# Patient Record
Sex: Female | Born: 1935 | Race: White | Hispanic: No | Smoking: Never smoker
Health system: Southern US, Community
[De-identification: ages and names within clinical notes are randomized; demographics above are authoritative.]

## PROBLEM LIST (undated history)

## (undated) DIAGNOSIS — I10 Essential (primary) hypertension: Secondary | ICD-10-CM

## (undated) DIAGNOSIS — I471 Supraventricular tachycardia, unspecified: Secondary | ICD-10-CM

## (undated) DIAGNOSIS — E039 Hypothyroidism, unspecified: Secondary | ICD-10-CM

## (undated) DIAGNOSIS — H269 Unspecified cataract: Secondary | ICD-10-CM

## (undated) DIAGNOSIS — C50919 Malignant neoplasm of unspecified site of unspecified female breast: Secondary | ICD-10-CM

## (undated) DIAGNOSIS — E785 Hyperlipidemia, unspecified: Secondary | ICD-10-CM

## (undated) DIAGNOSIS — R55 Syncope and collapse: Secondary | ICD-10-CM

## (undated) DIAGNOSIS — S82891A Other fracture of right lower leg, initial encounter for closed fracture: Secondary | ICD-10-CM

## (undated) HISTORY — DX: Supraventricular tachycardia: I47.1

## (undated) HISTORY — DX: Other fracture of right lower leg, initial encounter for closed fracture: S82.891A

## (undated) HISTORY — DX: Essential (primary) hypertension: I10

## (undated) HISTORY — DX: Supraventricular tachycardia, unspecified: I47.10

## (undated) HISTORY — DX: Malignant neoplasm of unspecified site of unspecified female breast: C50.919

## (undated) HISTORY — PX: APPENDECTOMY: SHX54

## (undated) HISTORY — PX: ABDOMINAL HYSTERECTOMY: SHX81

## (undated) HISTORY — DX: Hyperlipidemia, unspecified: E78.5

## (undated) HISTORY — DX: Unspecified cataract: H26.9

## (undated) HISTORY — DX: Syncope and collapse: R55

## (undated) HISTORY — DX: Hypothyroidism, unspecified: E03.9

## (undated) HISTORY — PX: TONSILLECTOMY: SUR1361

---

## 1985-01-06 HISTORY — PX: BREAST CYST EXCISION: SHX579

## 1998-01-06 DIAGNOSIS — S82891A Other fracture of right lower leg, initial encounter for closed fracture: Secondary | ICD-10-CM

## 1998-01-06 HISTORY — DX: Other fracture of right lower leg, initial encounter for closed fracture: S82.891A

## 1999-02-21 ENCOUNTER — Encounter: Payer: Self-pay | Admitting: *Deleted

## 1999-02-21 ENCOUNTER — Encounter: Admission: RE | Admit: 1999-02-21 | Discharge: 1999-02-21 | Payer: Self-pay | Admitting: *Deleted

## 2000-03-04 ENCOUNTER — Encounter: Payer: Self-pay | Admitting: *Deleted

## 2000-03-04 ENCOUNTER — Encounter: Admission: RE | Admit: 2000-03-04 | Discharge: 2000-03-04 | Payer: Self-pay | Admitting: *Deleted

## 2000-04-15 ENCOUNTER — Ambulatory Visit (HOSPITAL_COMMUNITY): Admission: RE | Admit: 2000-04-15 | Discharge: 2000-04-15 | Payer: Self-pay | Admitting: Cardiology

## 2001-03-09 ENCOUNTER — Encounter: Admission: RE | Admit: 2001-03-09 | Discharge: 2001-03-09 | Payer: Self-pay | Admitting: *Deleted

## 2001-03-09 ENCOUNTER — Encounter: Payer: Self-pay | Admitting: *Deleted

## 2001-03-12 ENCOUNTER — Encounter: Admission: RE | Admit: 2001-03-12 | Discharge: 2001-03-12 | Payer: Self-pay | Admitting: *Deleted

## 2001-03-12 ENCOUNTER — Encounter: Payer: Self-pay | Admitting: *Deleted

## 2002-03-18 ENCOUNTER — Encounter: Payer: Self-pay | Admitting: Family Medicine

## 2002-03-18 ENCOUNTER — Encounter: Admission: RE | Admit: 2002-03-18 | Discharge: 2002-03-18 | Payer: Self-pay | Admitting: Family Medicine

## 2003-03-20 ENCOUNTER — Encounter: Admission: RE | Admit: 2003-03-20 | Discharge: 2003-03-20 | Payer: Self-pay | Admitting: Family Medicine

## 2003-12-07 ENCOUNTER — Ambulatory Visit: Payer: Self-pay | Admitting: Family Medicine

## 2004-03-11 ENCOUNTER — Ambulatory Visit: Payer: Self-pay | Admitting: Family Medicine

## 2004-04-03 ENCOUNTER — Encounter: Admission: RE | Admit: 2004-04-03 | Discharge: 2004-04-03 | Payer: Self-pay | Admitting: Family Medicine

## 2004-04-16 ENCOUNTER — Encounter: Admission: RE | Admit: 2004-04-16 | Discharge: 2004-04-16 | Payer: Self-pay | Admitting: Family Medicine

## 2004-07-11 ENCOUNTER — Ambulatory Visit: Payer: Self-pay | Admitting: Family Medicine

## 2004-10-08 ENCOUNTER — Ambulatory Visit: Payer: Self-pay | Admitting: Family Medicine

## 2004-11-20 ENCOUNTER — Ambulatory Visit: Payer: Self-pay | Admitting: Family Medicine

## 2005-03-27 ENCOUNTER — Ambulatory Visit: Payer: Self-pay | Admitting: Family Medicine

## 2005-04-17 ENCOUNTER — Encounter: Admission: RE | Admit: 2005-04-17 | Discharge: 2005-04-17 | Payer: Self-pay | Admitting: Family Medicine

## 2005-06-17 ENCOUNTER — Ambulatory Visit: Payer: Self-pay | Admitting: Family Medicine

## 2005-10-16 ENCOUNTER — Ambulatory Visit: Payer: Self-pay | Admitting: Family Medicine

## 2005-10-29 ENCOUNTER — Ambulatory Visit: Payer: Self-pay | Admitting: Family Medicine

## 2005-12-03 ENCOUNTER — Ambulatory Visit: Payer: Self-pay | Admitting: Family Medicine

## 2006-02-17 ENCOUNTER — Ambulatory Visit: Payer: Self-pay | Admitting: Family Medicine

## 2006-04-01 ENCOUNTER — Ambulatory Visit: Payer: Self-pay | Admitting: Family Medicine

## 2006-04-21 ENCOUNTER — Encounter: Admission: RE | Admit: 2006-04-21 | Discharge: 2006-04-21 | Payer: Self-pay | Admitting: Family Medicine

## 2007-04-07 DIAGNOSIS — C50919 Malignant neoplasm of unspecified site of unspecified female breast: Secondary | ICD-10-CM

## 2007-04-07 HISTORY — DX: Malignant neoplasm of unspecified site of unspecified female breast: C50.919

## 2007-04-27 ENCOUNTER — Encounter: Admission: RE | Admit: 2007-04-27 | Discharge: 2007-04-27 | Payer: Self-pay | Admitting: Family Medicine

## 2007-05-05 ENCOUNTER — Encounter (INDEPENDENT_AMBULATORY_CARE_PROVIDER_SITE_OTHER): Payer: Self-pay | Admitting: Diagnostic Radiology

## 2007-05-05 ENCOUNTER — Encounter: Admission: RE | Admit: 2007-05-05 | Discharge: 2007-05-05 | Payer: Self-pay | Admitting: Family Medicine

## 2007-05-14 ENCOUNTER — Ambulatory Visit: Payer: Self-pay | Admitting: Oncology

## 2007-05-17 ENCOUNTER — Encounter: Admission: RE | Admit: 2007-05-17 | Discharge: 2007-05-17 | Payer: Self-pay | Admitting: Family Medicine

## 2007-05-18 LAB — CBC WITH DIFFERENTIAL/PLATELET
Basophils Absolute: 0 10*3/uL (ref 0.0–0.1)
EOS%: 0.8 % (ref 0.0–7.0)
MCH: 32.2 pg (ref 26.0–34.0)
MCV: 93.3 fL (ref 81.0–101.0)
MONO%: 7.7 % (ref 0.0–13.0)
RBC: 4.35 10*6/uL (ref 3.70–5.32)
RDW: 13.5 % (ref 11.3–14.5)

## 2007-05-19 LAB — COMPREHENSIVE METABOLIC PANEL
AST: 27 U/L (ref 0–37)
Albumin: 4.7 g/dL (ref 3.5–5.2)
Alkaline Phosphatase: 28 U/L — ABNORMAL LOW (ref 39–117)
BUN: 22 mg/dL (ref 6–23)
Potassium: 3.7 mEq/L (ref 3.5–5.3)

## 2007-05-19 LAB — VITAMIN D 25 HYDROXY (VIT D DEFICIENCY, FRACTURES): Vit D, 25-Hydroxy: 27 ng/mL — ABNORMAL LOW (ref 30–89)

## 2007-05-19 LAB — CANCER ANTIGEN 27.29: CA 27.29: 20 U/mL (ref 0–39)

## 2007-05-20 ENCOUNTER — Encounter: Admission: RE | Admit: 2007-05-20 | Discharge: 2007-05-20 | Payer: Self-pay | Admitting: Oncology

## 2007-05-20 ENCOUNTER — Ambulatory Visit (HOSPITAL_COMMUNITY): Admission: RE | Admit: 2007-05-20 | Discharge: 2007-05-20 | Payer: Self-pay | Admitting: Oncology

## 2007-05-24 ENCOUNTER — Ambulatory Visit (HOSPITAL_COMMUNITY): Admission: RE | Admit: 2007-05-24 | Discharge: 2007-05-24 | Payer: Self-pay | Admitting: Oncology

## 2007-06-09 LAB — CBC WITH DIFFERENTIAL/PLATELET
EOS%: 0.8 % (ref 0.0–7.0)
MCH: 32.4 pg (ref 26.0–34.0)
MCV: 93.6 fL (ref 81.0–101.0)
MONO%: 9.7 % (ref 0.0–13.0)
RBC: 4.53 10*6/uL (ref 3.70–5.32)
RDW: 12.4 % (ref 11.3–14.5)

## 2007-06-09 LAB — PROTIME-INR
INR: 1.8 — ABNORMAL LOW (ref 2.00–3.50)
Protime: 21.6 Seconds — ABNORMAL HIGH (ref 10.6–13.4)

## 2007-07-13 ENCOUNTER — Ambulatory Visit: Payer: Self-pay | Admitting: Oncology

## 2007-07-16 LAB — CBC WITH DIFFERENTIAL/PLATELET
BASO%: 0.6 % (ref 0.0–2.0)
EOS%: 0.8 % (ref 0.0–7.0)
HCT: 41.4 % (ref 34.8–46.6)
LYMPH%: 25 % (ref 14.0–48.0)
MCH: 32.2 pg (ref 26.0–34.0)
MCHC: 34.8 g/dL (ref 32.0–36.0)
MONO#: 0.4 10*3/uL (ref 0.1–0.9)
NEUT%: 66.4 % (ref 39.6–76.8)
RBC: 4.47 10*6/uL (ref 3.70–5.32)
WBC: 6.2 10*3/uL (ref 3.9–10.0)
lymph#: 1.6 10*3/uL (ref 0.9–3.3)

## 2007-07-16 LAB — COMPREHENSIVE METABOLIC PANEL
ALT: 26 U/L (ref 0–35)
AST: 29 U/L (ref 0–37)
Chloride: 104 mEq/L (ref 96–112)
Creatinine, Ser: 0.77 mg/dL (ref 0.40–1.20)
Sodium: 138 mEq/L (ref 135–145)
Total Bilirubin: 0.5 mg/dL (ref 0.3–1.2)
Total Protein: 6.9 g/dL (ref 6.0–8.3)

## 2007-09-08 ENCOUNTER — Ambulatory Visit: Payer: Self-pay | Admitting: Oncology

## 2007-09-14 LAB — CBC WITH DIFFERENTIAL/PLATELET
Basophils Absolute: 0 10*3/uL (ref 0.0–0.1)
EOS%: 0.8 % (ref 0.0–7.0)
Eosinophils Absolute: 0 10*3/uL (ref 0.0–0.5)
HCT: 40.7 % (ref 34.8–46.6)
HGB: 14.2 g/dL (ref 11.6–15.9)
MCH: 32.8 pg (ref 26.0–34.0)
NEUT#: 3.7 10*3/uL (ref 1.5–6.5)
NEUT%: 64.3 % (ref 39.6–76.8)
RDW: 13.1 % (ref 11.3–14.5)
lymph#: 1.4 10*3/uL (ref 0.9–3.3)

## 2007-09-14 LAB — LACTATE DEHYDROGENASE: LDH: 142 U/L (ref 94–250)

## 2007-09-14 LAB — COMPREHENSIVE METABOLIC PANEL
Albumin: 4.1 g/dL (ref 3.5–5.2)
BUN: 16 mg/dL (ref 6–23)
CO2: 22 mEq/L (ref 19–32)
Calcium: 9.5 mg/dL (ref 8.4–10.5)
Chloride: 107 mEq/L (ref 96–112)
Creatinine, Ser: 0.74 mg/dL (ref 0.40–1.20)
Glucose, Bld: 152 mg/dL — ABNORMAL HIGH (ref 70–99)
Potassium: 4.1 mEq/L (ref 3.5–5.3)

## 2007-09-14 LAB — PROTHROMBIN TIME: INR: 3.8 — ABNORMAL HIGH (ref 0.0–1.5)

## 2007-09-14 LAB — CANCER ANTIGEN 27.29: CA 27.29: 24 U/mL (ref 0–39)

## 2007-09-15 LAB — CELL SEARCH FOR BREAST CANCER

## 2007-11-15 ENCOUNTER — Encounter: Admission: RE | Admit: 2007-11-15 | Discharge: 2007-11-15 | Payer: Self-pay | Admitting: Oncology

## 2007-11-23 ENCOUNTER — Ambulatory Visit: Payer: Self-pay | Admitting: Oncology

## 2007-11-25 LAB — COMPREHENSIVE METABOLIC PANEL
Albumin: 4.3 g/dL (ref 3.5–5.2)
CO2: 25 mEq/L (ref 19–32)
Calcium: 9.6 mg/dL (ref 8.4–10.5)
Glucose, Bld: 175 mg/dL — ABNORMAL HIGH (ref 70–99)
Potassium: 3.9 mEq/L (ref 3.5–5.3)
Sodium: 141 mEq/L (ref 135–145)
Total Protein: 7.3 g/dL (ref 6.0–8.3)

## 2007-11-25 LAB — CBC WITH DIFFERENTIAL/PLATELET
Eosinophils Absolute: 0.1 10*3/uL (ref 0.0–0.5)
HCT: 41.6 % (ref 34.8–46.6)
LYMPH%: 23.5 % (ref 14.0–48.0)
MCHC: 34.2 g/dL (ref 32.0–36.0)
MCV: 95.4 fL (ref 81.0–101.0)
MONO#: 0.4 10*3/uL (ref 0.1–0.9)
MONO%: 7 % (ref 0.0–13.0)
NEUT#: 4.1 10*3/uL (ref 1.5–6.5)
NEUT%: 67.9 % (ref 39.6–76.8)
Platelets: 242 10*3/uL (ref 145–400)
WBC: 6 10*3/uL (ref 3.9–10.0)

## 2007-11-25 LAB — LACTATE DEHYDROGENASE: LDH: 147 U/L (ref 94–250)

## 2008-01-17 ENCOUNTER — Ambulatory Visit: Payer: Self-pay | Admitting: Oncology

## 2008-02-07 LAB — CBC WITH DIFFERENTIAL/PLATELET
Basophils Absolute: 0 10*3/uL (ref 0.0–0.1)
Eosinophils Absolute: 0.1 10*3/uL (ref 0.0–0.5)
HGB: 14.6 g/dL (ref 11.6–15.9)
MONO#: 0.4 10*3/uL (ref 0.1–0.9)
MONO%: 5.9 % (ref 0.0–13.0)
NEUT#: 4.4 10*3/uL (ref 1.5–6.5)
RBC: 4.49 10*6/uL (ref 3.70–5.32)
RDW: 13.3 % (ref 11.3–14.5)
WBC: 6.3 10*3/uL (ref 3.9–10.0)
lymph#: 1.5 10*3/uL (ref 0.9–3.3)

## 2008-02-08 LAB — COMPREHENSIVE METABOLIC PANEL
Albumin: 4.5 g/dL (ref 3.5–5.2)
Alkaline Phosphatase: 34 U/L — ABNORMAL LOW (ref 39–117)
BUN: 19 mg/dL (ref 6–23)
CO2: 25 mEq/L (ref 19–32)
Calcium: 9.8 mg/dL (ref 8.4–10.5)
Chloride: 103 mEq/L (ref 96–112)
Glucose, Bld: 153 mg/dL — ABNORMAL HIGH (ref 70–99)
Potassium: 3.8 mEq/L (ref 3.5–5.3)
Sodium: 140 mEq/L (ref 135–145)
Total Protein: 7.2 g/dL (ref 6.0–8.3)

## 2008-02-08 LAB — CANCER ANTIGEN 27.29: CA 27.29: 16 U/mL (ref 0–39)

## 2008-02-25 ENCOUNTER — Encounter: Admission: RE | Admit: 2008-02-25 | Discharge: 2008-02-25 | Payer: Self-pay | Admitting: Surgery

## 2008-05-02 ENCOUNTER — Encounter: Admission: RE | Admit: 2008-05-02 | Discharge: 2008-05-02 | Payer: Self-pay | Admitting: Oncology

## 2008-05-17 ENCOUNTER — Ambulatory Visit: Payer: Self-pay | Admitting: Oncology

## 2008-05-19 LAB — CBC WITH DIFFERENTIAL/PLATELET
BASO%: 0.5 % (ref 0.0–2.0)
EOS%: 0.9 % (ref 0.0–7.0)
LYMPH%: 21.9 % (ref 14.0–49.7)
MCH: 32.3 pg (ref 25.1–34.0)
MCHC: 34.2 g/dL (ref 31.5–36.0)
MONO#: 0.4 10*3/uL (ref 0.1–0.9)
NEUT%: 70.8 % (ref 38.4–76.8)
Platelets: 262 10*3/uL (ref 145–400)
RBC: 4.37 10*6/uL (ref 3.70–5.45)
WBC: 7 10*3/uL (ref 3.9–10.3)
lymph#: 1.5 10*3/uL (ref 0.9–3.3)

## 2008-05-19 LAB — COMPREHENSIVE METABOLIC PANEL
Alkaline Phosphatase: 31 U/L — ABNORMAL LOW (ref 39–117)
CO2: 23 mEq/L (ref 19–32)
Creatinine, Ser: 0.81 mg/dL (ref 0.40–1.20)
Glucose, Bld: 161 mg/dL — ABNORMAL HIGH (ref 70–99)
Sodium: 138 mEq/L (ref 135–145)
Total Bilirubin: 0.5 mg/dL (ref 0.3–1.2)
Total Protein: 6.9 g/dL (ref 6.0–8.3)

## 2008-05-19 LAB — PROTIME-INR: Protime: 26.4 Seconds — ABNORMAL HIGH (ref 10.6–13.4)

## 2008-05-19 LAB — LACTATE DEHYDROGENASE: LDH: 151 U/L (ref 94–250)

## 2008-05-19 LAB — CANCER ANTIGEN 27.29: CA 27.29: 18 U/mL (ref 0–39)

## 2008-05-24 LAB — CELL SEARCH FOR BREAST CANCER

## 2008-06-06 HISTORY — PX: MASTECTOMY: SHX3

## 2008-06-13 ENCOUNTER — Encounter: Admission: RE | Admit: 2008-06-13 | Discharge: 2008-06-13 | Payer: Self-pay | Admitting: Surgery

## 2008-06-15 ENCOUNTER — Encounter (INDEPENDENT_AMBULATORY_CARE_PROVIDER_SITE_OTHER): Payer: Self-pay | Admitting: Surgery

## 2008-06-15 ENCOUNTER — Ambulatory Visit (HOSPITAL_BASED_OUTPATIENT_CLINIC_OR_DEPARTMENT_OTHER): Admission: RE | Admit: 2008-06-15 | Discharge: 2008-06-16 | Payer: Self-pay | Admitting: Surgery

## 2008-06-29 ENCOUNTER — Encounter: Admission: RE | Admit: 2008-06-29 | Discharge: 2008-06-29 | Payer: Self-pay | Admitting: Surgery

## 2008-07-12 ENCOUNTER — Ambulatory Visit: Admission: RE | Admit: 2008-07-12 | Discharge: 2008-09-12 | Payer: Self-pay | Admitting: Radiation Oncology

## 2008-09-05 ENCOUNTER — Ambulatory Visit: Payer: Self-pay | Admitting: Oncology

## 2008-09-07 LAB — CBC WITH DIFFERENTIAL/PLATELET
Basophils Absolute: 0 10*3/uL (ref 0.0–0.1)
Eosinophils Absolute: 0.1 10*3/uL (ref 0.0–0.5)
HGB: 13 g/dL (ref 11.6–15.9)
MCV: 94.7 fL (ref 79.5–101.0)
MONO#: 0.6 10*3/uL (ref 0.1–0.9)
NEUT#: 3.1 10*3/uL (ref 1.5–6.5)
RDW: 13.3 % (ref 11.2–14.5)
WBC: 4.8 10*3/uL (ref 3.9–10.3)
lymph#: 0.9 10*3/uL (ref 0.9–3.3)

## 2008-09-07 LAB — COMPREHENSIVE METABOLIC PANEL
Albumin: 3.6 g/dL (ref 3.5–5.2)
BUN: 16 mg/dL (ref 6–23)
Calcium: 9.7 mg/dL (ref 8.4–10.5)
Chloride: 109 mEq/L (ref 96–112)
Glucose, Bld: 110 mg/dL — ABNORMAL HIGH (ref 70–99)
Potassium: 4.2 mEq/L (ref 3.5–5.3)
Sodium: 141 mEq/L (ref 135–145)
Total Protein: 6.7 g/dL (ref 6.0–8.3)

## 2008-09-07 LAB — CANCER ANTIGEN 27.29: CA 27.29: 19 U/mL (ref 0–39)

## 2008-09-07 LAB — PROTIME-INR: INR: 1 — ABNORMAL LOW (ref 2.00–3.50)

## 2009-01-06 HISTORY — PX: CATARACT EXTRACTION: SUR2

## 2009-03-06 ENCOUNTER — Ambulatory Visit: Payer: Self-pay | Admitting: Oncology

## 2009-03-08 LAB — CBC WITH DIFFERENTIAL/PLATELET
BASO%: 0.5 % (ref 0.0–2.0)
HCT: 41.1 % (ref 34.8–46.6)
HGB: 13.9 g/dL (ref 11.6–15.9)
MCHC: 33.9 g/dL (ref 31.5–36.0)
MONO#: 0.6 10*3/uL (ref 0.1–0.9)
NEUT%: 63.2 % (ref 38.4–76.8)
RDW: 13.5 % (ref 11.2–14.5)
WBC: 6.1 10*3/uL (ref 3.9–10.3)
lymph#: 1.5 10*3/uL (ref 0.9–3.3)

## 2009-03-09 LAB — COMPREHENSIVE METABOLIC PANEL
ALT: 19 U/L (ref 0–35)
AST: 25 U/L (ref 0–37)
Albumin: 4.2 g/dL (ref 3.5–5.2)
CO2: 23 mEq/L (ref 19–32)
Calcium: 9.8 mg/dL (ref 8.4–10.5)
Chloride: 103 mEq/L (ref 96–112)
Creatinine, Ser: 0.71 mg/dL (ref 0.40–1.20)
Potassium: 3.8 mEq/L (ref 3.5–5.3)
Sodium: 139 mEq/L (ref 135–145)
Total Protein: 7.1 g/dL (ref 6.0–8.3)

## 2009-03-09 LAB — LACTATE DEHYDROGENASE: LDH: 129 U/L (ref 94–250)

## 2009-05-31 ENCOUNTER — Encounter: Admission: RE | Admit: 2009-05-31 | Discharge: 2009-05-31 | Payer: Self-pay | Admitting: Oncology

## 2009-07-02 ENCOUNTER — Encounter: Admission: RE | Admit: 2009-07-02 | Discharge: 2009-07-02 | Payer: Self-pay | Admitting: Oncology

## 2009-09-20 ENCOUNTER — Ambulatory Visit: Payer: Self-pay | Admitting: Oncology

## 2009-09-24 LAB — CBC WITH DIFFERENTIAL/PLATELET
BASO%: 0.3 % (ref 0.0–2.0)
Basophils Absolute: 0 10e3/uL (ref 0.0–0.1)
EOS%: 0.9 % (ref 0.0–7.0)
Eosinophils Absolute: 0 10e3/uL (ref 0.0–0.5)
HCT: 42.6 % (ref 34.8–46.6)
HGB: 14 g/dL (ref 11.6–15.9)
LYMPH%: 17.9 % (ref 14.0–49.7)
MCH: 31.9 pg (ref 25.1–34.0)
MCHC: 32.9 g/dL (ref 31.5–36.0)
MCV: 97 fL (ref 79.5–101.0)
MONO#: 0.4 10e3/uL (ref 0.1–0.9)
MONO%: 7.6 % (ref 0.0–14.0)
NEUT#: 4.1 10e3/uL (ref 1.5–6.5)
NEUT%: 73.3 % (ref 38.4–76.8)
Platelets: 276 10e3/uL (ref 145–400)
RBC: 4.39 10e6/uL (ref 3.70–5.45)
RDW: 13.4 % (ref 11.2–14.5)
WBC: 5.7 10e3/uL (ref 3.9–10.3)
lymph#: 1 10e3/uL (ref 0.9–3.3)

## 2009-09-24 LAB — COMPREHENSIVE METABOLIC PANEL
AST: 23 U/L (ref 0–37)
Alkaline Phosphatase: 28 U/L — ABNORMAL LOW (ref 39–117)
BUN: 17 mg/dL (ref 6–23)
Calcium: 9.9 mg/dL (ref 8.4–10.5)
Creatinine, Ser: 0.86 mg/dL (ref 0.40–1.20)

## 2009-09-24 LAB — VITAMIN D 25 HYDROXY (VIT D DEFICIENCY, FRACTURES): Vit D, 25-Hydroxy: 47 ng/mL (ref 30–89)

## 2009-09-24 LAB — CANCER ANTIGEN 27.29: CA 27.29: 10 U/mL (ref 0–39)

## 2010-01-27 ENCOUNTER — Encounter: Payer: Self-pay | Admitting: Oncology

## 2010-01-27 ENCOUNTER — Encounter: Payer: Self-pay | Admitting: Family Medicine

## 2010-03-26 ENCOUNTER — Encounter (HOSPITAL_BASED_OUTPATIENT_CLINIC_OR_DEPARTMENT_OTHER): Payer: Medicare Other | Admitting: Oncology

## 2010-03-26 ENCOUNTER — Other Ambulatory Visit: Payer: Self-pay | Admitting: Oncology

## 2010-03-26 DIAGNOSIS — Z86718 Personal history of other venous thrombosis and embolism: Secondary | ICD-10-CM

## 2010-03-26 DIAGNOSIS — C50419 Malignant neoplasm of upper-outer quadrant of unspecified female breast: Secondary | ICD-10-CM

## 2010-03-26 DIAGNOSIS — Z7901 Long term (current) use of anticoagulants: Secondary | ICD-10-CM

## 2010-03-26 LAB — CBC WITH DIFFERENTIAL/PLATELET
BASO%: 0.3 % (ref 0.0–2.0)
HCT: 39.7 % (ref 34.8–46.6)
LYMPH%: 19.7 % (ref 14.0–49.7)
MCH: 32.9 pg (ref 25.1–34.0)
MCHC: 34.8 g/dL (ref 31.5–36.0)
MCV: 94.5 fL (ref 79.5–101.0)
MONO%: 9.2 % (ref 0.0–14.0)
NEUT%: 67.8 % (ref 38.4–76.8)
Platelets: 296 10*3/uL (ref 145–400)
RBC: 4.21 10*6/uL (ref 3.70–5.45)

## 2010-03-27 LAB — COMPREHENSIVE METABOLIC PANEL
ALT: 16 U/L (ref 0–35)
AST: 20 U/L (ref 0–37)
Albumin: 3.9 g/dL (ref 3.5–5.2)
Calcium: 10.1 mg/dL (ref 8.4–10.5)
Chloride: 103 mEq/L (ref 96–112)
Creatinine, Ser: 0.8 mg/dL (ref 0.40–1.20)
Potassium: 4 mEq/L (ref 3.5–5.3)
Sodium: 141 mEq/L (ref 135–145)
Total Protein: 6.8 g/dL (ref 6.0–8.3)

## 2010-04-15 LAB — POCT I-STAT, CHEM 8
Creatinine, Ser: 0.8 mg/dL (ref 0.4–1.2)
Glucose, Bld: 95 mg/dL (ref 70–99)
Hemoglobin: 15.3 g/dL — ABNORMAL HIGH (ref 12.0–15.0)
Potassium: 4.1 mEq/L (ref 3.5–5.1)

## 2010-04-15 LAB — PROTIME-INR: Prothrombin Time: 12.8 seconds (ref 11.6–15.2)

## 2010-05-21 NOTE — Op Note (Signed)
NAMEADRIAHNA, SHEARMAN              ACCOUNT NO.:  [id number]   MEDICAL RECORD NO.:  [id number]          PATIENT TYPE:  AMB   LOCATION:  DSC                          FACILITY:  [facility]   PHYSICIAN:  Sandria Bales. Ezzard Standing, M.D.  DATE OF BIRTH:  06/06/1926   DATE OF PROCEDURE:  06/15/2008  DATE OF DISCHARGE:                               OPERATIVE REPORT   Date of Surgery - 15 June 2008.   PREOPERATIVE DIAGNOSIS:  Multifocal left breast carcinoma status post  neoadjuvant hormonal therapy.   POSTOPERATIVE DIAGNOSIS:  Multifocal left breast carcinoma status post  neoadjuvant hormonal therapy with Femara, negative sentinel lymph node.   PROCEDURE:  Injection of methylene blue, left simple mastectomy,  inferior mastectomy skin margin, and left axillary sentinel lymph node  biopsy.   SURGEON:  Sandria Bales. Ezzard Standing, MD   FIRST ASSISTANT:  None.   ANESTHESIA:  General endotracheal.   ESTIMATED BLOOD LOSS:  Minimal.   INDICATIONS FOR PROCEDURE:  Ms. Sharpless is a 77 year old white female who  is a patient of Dr. Joette Catching who was found to have a left breast  cancer on biopsy in May 05, 2007.  This tumor was strongly ER and PR  receptor positive and HER-2/neu negative.  The tumor was large involving  her almost entire breast and skin on initial evaluation.   She was placed on hormone-blocking agent [Femara] as a neoadjuvant  treatment.  She saw Dr. Pierce Crane and  has had significant resolution  in  both the size and fixation of this tumor.  She has now been on  hormonal therapy with Femara for over 1 year's time.  It appears that  the tumor has become small as of today, mastectomy appears as a  possibility.  I have discussed with patient the indications and  potential complications of surgery.   The potential complications include, but not limited to, bleeding,  infection, nerve injury, and recurrence of the tumor.  We also discussed  immediate breast reconstruction procedure which she did  not want to  consider.   OPERATIVE NOTE:  The patient was placed in the supine position.  After  general LMA anesthesia, her left breast and axilla and arm were prepped  with DuraPrep.  She had been injected preoperatively around the left  areola with 1 mCi of technetium sulfur colloid.  I injected about a cc  of 60% methylene blue in a circumareolar fashion.   I then prepped her breast.  A time-out was held identifying the patient  and the procedure.   I started with the left axilla.  I found a hot nodule, high in left  axilla.  I made an incision directly over this, excised the block of  fat.  It had at least 2 lymph nodes and with counts of 800 with a  background count of 10, but the lymph nodes were not blue.  They were  somewhat large, but not overly hard.   Touch prep by Dr. Charlott Rakes revealed negative for cancer cells.   I then proceeded with my left simple mastectomy.  I made an elliptical  incision excising the areola and angling a little bit up to toward the  axilla.  She has very small breast which she basically had no plane  between her breast and the skin.  I went medially to the lateral edge of  the sternum and inferiorly to the investing fascia of the rectus  abdominal muscle, superiorly to approximately 3-4 fingerbreadths below  the clavicle, and laterally over to latissimus dorsi.   I then elevated the breast off the pectoralis major using primarily  Bovie electrocautery.  I did use 3-0 Vicryl sutures for some bleeders.   After the entire specimen had been excised, I then put a long suture on  the lateral margin to orient the specimen.   There was no gross residual tumor noted behind.  The chest Vogelgesang was not  involved.  I took the dissection down to the chest Engram.  Of note, in  her inferior margin, there was a little bit of nodularity, and she had a  prior biopsy in that area too.  So, I went to the secondary excision of  her inferior mastectomy flap.  I  marked this with a long suture  inferiorly and a short suture laterally and sent this as a separate  specimen.  I then closed her mastectomy scar, brought out a 19-round  Blake drain through a stab wound below the mastectomy, actually threaded  this even up into the axilla to kind of cover both the axilla and the  breast.  I thought that one drain will be fine.  I sewed this in place  with a 2-0 nylon suture.  I then closed the subcutaneous tissues with a  3-0 Vicryl suture.  I closed the skin with a combination skin staples  and 2-0 nylon sutures as this was under fair amount of tension.   She had tolerated procedure well, was transported to recovery room in  good condition.  I will plan to keep her overnight for observation.      Sandria Bales. Ezzard Standing, M.D.  Electronically Signed     DHN/MEDQ  D:  06/15/2008  T:  06/16/2008  Job:  [id number]   cc:   Pierce Crane, MD  Delaney Meigs, M.D.

## 2010-05-24 NOTE — H&P (Signed)
[place]  Patient:    Erica Conner, Erica Conner                       MRN: [id number] Adm. Date:  04/15/00 Attending:  Colleen Can. Deborah Chalk, M.D. Dictator:   Jennet Maduro. Earl Gala, R.N., A.N.P. CC:         Delaney Meigs, M.D.   History and Physical  DATE OF BIRTH:  [birth year]  CHIEF COMPLAINT:  Chest pain.  HISTORY OF PRESENT ILLNESS:  Erica Conner is a very pleasant 77 year old female who has had a history of SVT in the past that has been associated with prior syncope.  She has been managed medically with atenolol, verapamil, and Lanoxin therapy.  She has been somewhat reluctant to proceed on with ablation.  She presented for her regular follow-up appointment on April 13, 2000 and at that time was complaining of ongoing weakness and fatigue.  She also noted that over the previous week she had had chest discomfort which she describes as more of a soreness; it was basically bothering her to lie on her left side, and it primarily occurred at night.  A 12-lead electrocardiogram was obtained in the office and she does have T wave changes when compared to her previous tracing from January 2002.  In light of these symptoms, as well as abnormal EKG, she is referred on now for elective cardiac catheterization.  PAST MEDICAL HISTORY: 1. SVT with a history of prior syncope. 2. Hypothyroidism, on replacement. 3. Hypertensive heart disease.  She had 2-D echocardiogram in March 2001 with    LVH noted with normal LV systolic function. 4. Status post hysterectomy, on hormone replacement therapy.  ALLERGIES:  PENICILLIN, SULFA, and ADHESIVE TAPE.  FAMILY HISTORY:  Her father died at age 61 with heart attack and hypertension. Mother died age 62 with a history of coronary disease as well as hypertension.  SOCIAL HISTORY:  She is married.  There is no smoking, no alcohol use.  REVIEW OF SYSTEMS:  Is basically as noted above.  When she was seen earlier in March 2002  at that time she was also complaining of feeling weak and washed out.  Her atenolol was reduced and she was started on verapamil but these symptoms have persisted.  She has had no recent fever or flu, no real shortness of breath.  She tries to remain as active as possible.  CURRENT MEDICATIONS: 1. Verapamil SR 120 daily. 2. Lanoxin 0.25 daily. 3. Atenolol 50 mg daily. 4. Synthroid 0.075 daily. 5. Premarin 0.625 daily. 6. Calcium tablet daily. 7. Baby aspirin daily. 8. Multivitamin daily.  PHYSICAL EXAMINATION:  GENERAL:  She is a very pleasant elderly white female in no acute distress. She is alert and cooperative.  VITAL SIGNS:  Blood pressure 148/88 sitting and standing, heart rate in the 90s, respirations are 20, she is afebrile.  SKIN:  Warm and dry.  Color is unremarkable.  NECK:  Supple.  There are no bruits.  LUNGS:  Clear.  HEART:  Shows a regular rhythm.  ABDOMEN:  Soft, positive bowel sounds.  EXTREMITIES:  Show no evidence of edema.  NEUROLOGIC:  Intact with no gross focal deficits.  LABORATORY DATA:  PT and PTT are unremarkable.  Chemistries are all within normal limits.  CBC shows a hemoglobin 13, hematocrit 39.  A 12-lead electrocardiogram showing nonspecific ST and T wave changes.  There is T wave inversion in the inferolateral leads, which is new  compared to tracings from January 2002.  Chest x-ray performed in the office is unremarkable.  OVERALL IMPRESSION: 1. Chest pain in the setting of ongoing weakness and fatigue with abnormal    EKG. 2. History of supraventricular tachycardia with prior syncope. 3. Hypertension. 4. Hypothyroidism.  PLAN:  Will proceed on with elective cardiac catheterization.  The risks, procedure, and benefits have been explained, to include the risks of bleeding, bruising, blood clot to the leg, as well as allergy to the x-ray dye, as well as the possibility of stroke, heart attack, irregular rhythms, and even  the possibility of death have all been discussed and she is willing to proceed.DD: 04/14/00 TD:  04/14/00 Job: [id number]

## 2010-05-24 NOTE — Cardiovascular Report (Signed)
[place]. [facility]  Patient:    Erica Conner, Erica Conner                     MRN: [id number] Proc. Date: 04/15/00 Adm. Date:  [id number] Attending:  Eleanora Neighbor                        Cardiac Catheterization  HISTORY:  [name] is a 77 year old female referred for evaluation of chest pain.  She has had supraventricular tachycardia, managed on medications.  She has had a vague chest discomfort and weakness.  She has a history of hypertension and hypothyroidism.  PROCEDURE:  Left heart catheterization with selective coronary angiography and left ventricular angiography.  TYPE AND SITE OF ENTRY:  Percutaneous, right femoral artery.  CATHETERS:  6-French four-curved Judkins right and left coronary catheters, 6-French pigtail ventriculographic catheter.  CONTRAST MATERIAL:  Omnipaque.  MEDICATION GIVEN PRIOR TO PROCEDURE:  Valium 10 mg p.o.  MEDICATION GIVEN DURING PROCEDURE:  Versed 2 mg IV, vancomycin 500 mg IV.  COMMENTS:  Patient tolerated the procedure well.  HEMODYNAMIC DATA:  The aortic pressure was 165/78 and LV was 171/17.  There was no aortic valve gradient noted on pullback.  ANGIOGRAPHIC DATA: 1. Left main coronary artery is normal. 2. Left anterior descending:  The left anterior descending is a reasonably    long vessel that crosses the apex.  In the midportion, there seems to be    more of a narrowing of the vessel and some irregularity, and then    tortuosity toward the distal part of the vessel.  There clearly is no    significant focal stenosis.  Diagonal vessels are relatively small. 3. Left circumflex:  The left circumflex is normal. 4. Right coronary artery:  The right coronary artery is a dominant vessel.    The posterior descending and the posterolateral branches are relatively    small but the main body of the right coronary artery is essentially normal.  LEFT VENTRICULAR ANGIOGRAM:  Left ventricular angiogram was performed  in the RAO position.  Overall cardiac size and silhouette are normal.  The left ventricle was hyperdynamic with a global ejection fraction in the 80-90% range.  There is no significant mitral regurgitation, intracardiac calcification or intracavitary filling defect.  OVERALL IMPRESSION: 1. Hyperdynamic left ventricle. 2. Essentially normal coronary arteries with segmental irregularities in the    mid left anterior descending with a tortuous segment thereafter.  DISCUSSION:  It is felt that [name] is free of significant coronary atherosclerosis but probably has hypertensive heart disease and hyperdynamic LV function.  ADDENDUM:  Perclose was successful. DD:  04/15/00 TD:  04/15/00 Job: [id number]

## 2010-06-24 ENCOUNTER — Other Ambulatory Visit: Payer: Self-pay | Admitting: Oncology

## 2010-06-24 DIAGNOSIS — Z9012 Acquired absence of left breast and nipple: Secondary | ICD-10-CM

## 2010-07-12 ENCOUNTER — Ambulatory Visit
Admission: RE | Admit: 2010-07-12 | Discharge: 2010-07-12 | Disposition: A | Discharge status: Home / Self Care | Payer: Medicare Other | Source: Ambulatory Visit | Attending: Oncology | Admitting: Oncology

## 2010-07-12 DIAGNOSIS — Z9012 Acquired absence of left breast and nipple: Secondary | ICD-10-CM

## 2010-10-24 ENCOUNTER — Encounter (HOSPITAL_BASED_OUTPATIENT_CLINIC_OR_DEPARTMENT_OTHER): Payer: Medicare Other | Admitting: Oncology

## 2010-10-24 ENCOUNTER — Other Ambulatory Visit: Payer: Self-pay | Admitting: Oncology

## 2010-10-24 DIAGNOSIS — Z86718 Personal history of other venous thrombosis and embolism: Secondary | ICD-10-CM

## 2010-10-24 DIAGNOSIS — Z7901 Long term (current) use of anticoagulants: Secondary | ICD-10-CM

## 2010-10-24 DIAGNOSIS — C50419 Malignant neoplasm of upper-outer quadrant of unspecified female breast: Secondary | ICD-10-CM

## 2010-10-24 DIAGNOSIS — Z853 Personal history of malignant neoplasm of breast: Secondary | ICD-10-CM

## 2010-10-24 LAB — CBC WITH DIFFERENTIAL/PLATELET
Basophils Absolute: 0 10*3/uL (ref 0.0–0.1)
Eosinophils Absolute: 0.1 10*3/uL (ref 0.0–0.5)
HGB: 13.9 g/dL (ref 11.6–15.9)
MCV: 96.1 fL (ref 79.5–101.0)
MONO#: 0.5 10*3/uL (ref 0.1–0.9)
MONO%: 8.3 % (ref 0.0–14.0)
NEUT#: 4.1 10*3/uL (ref 1.5–6.5)
RBC: 4.22 10*6/uL (ref 3.70–5.45)
RDW: 13.3 % (ref 11.2–14.5)
WBC: 6.1 10*3/uL (ref 3.9–10.3)
lymph#: 1.4 10*3/uL (ref 0.9–3.3)

## 2010-10-24 LAB — COMPREHENSIVE METABOLIC PANEL
AST: 21 U/L (ref 0–37)
Albumin: 4.1 g/dL (ref 3.5–5.2)
Alkaline Phosphatase: 28 U/L — ABNORMAL LOW (ref 39–117)
BUN: 16 mg/dL (ref 6–23)
Calcium: 9.8 mg/dL (ref 8.4–10.5)
Creatinine, Ser: 0.85 mg/dL (ref 0.50–1.10)
Glucose, Bld: 115 mg/dL — ABNORMAL HIGH (ref 70–99)
Potassium: 3.8 mEq/L (ref 3.5–5.3)

## 2010-10-24 LAB — VITAMIN D 25 HYDROXY (VIT D DEFICIENCY, FRACTURES): Vit D, 25-Hydroxy: 38 ng/mL (ref 30–89)

## 2011-02-11 ENCOUNTER — Telehealth: Payer: Self-pay | Admitting: *Deleted

## 2011-02-11 NOTE — Telephone Encounter (Signed)
Called and notified pt of future scheduled appts

## 2011-03-08 IMAGING — CR DG CHEST 2V
2 series · 2 of 2 positions shown · non-contrast
Comparison: CT chest of 05/20/2007

CLINICAL DATA: Preop for surgery for left breast carcinoma,
hypertension

CHEST - 2 VIEW

[w chest pa]
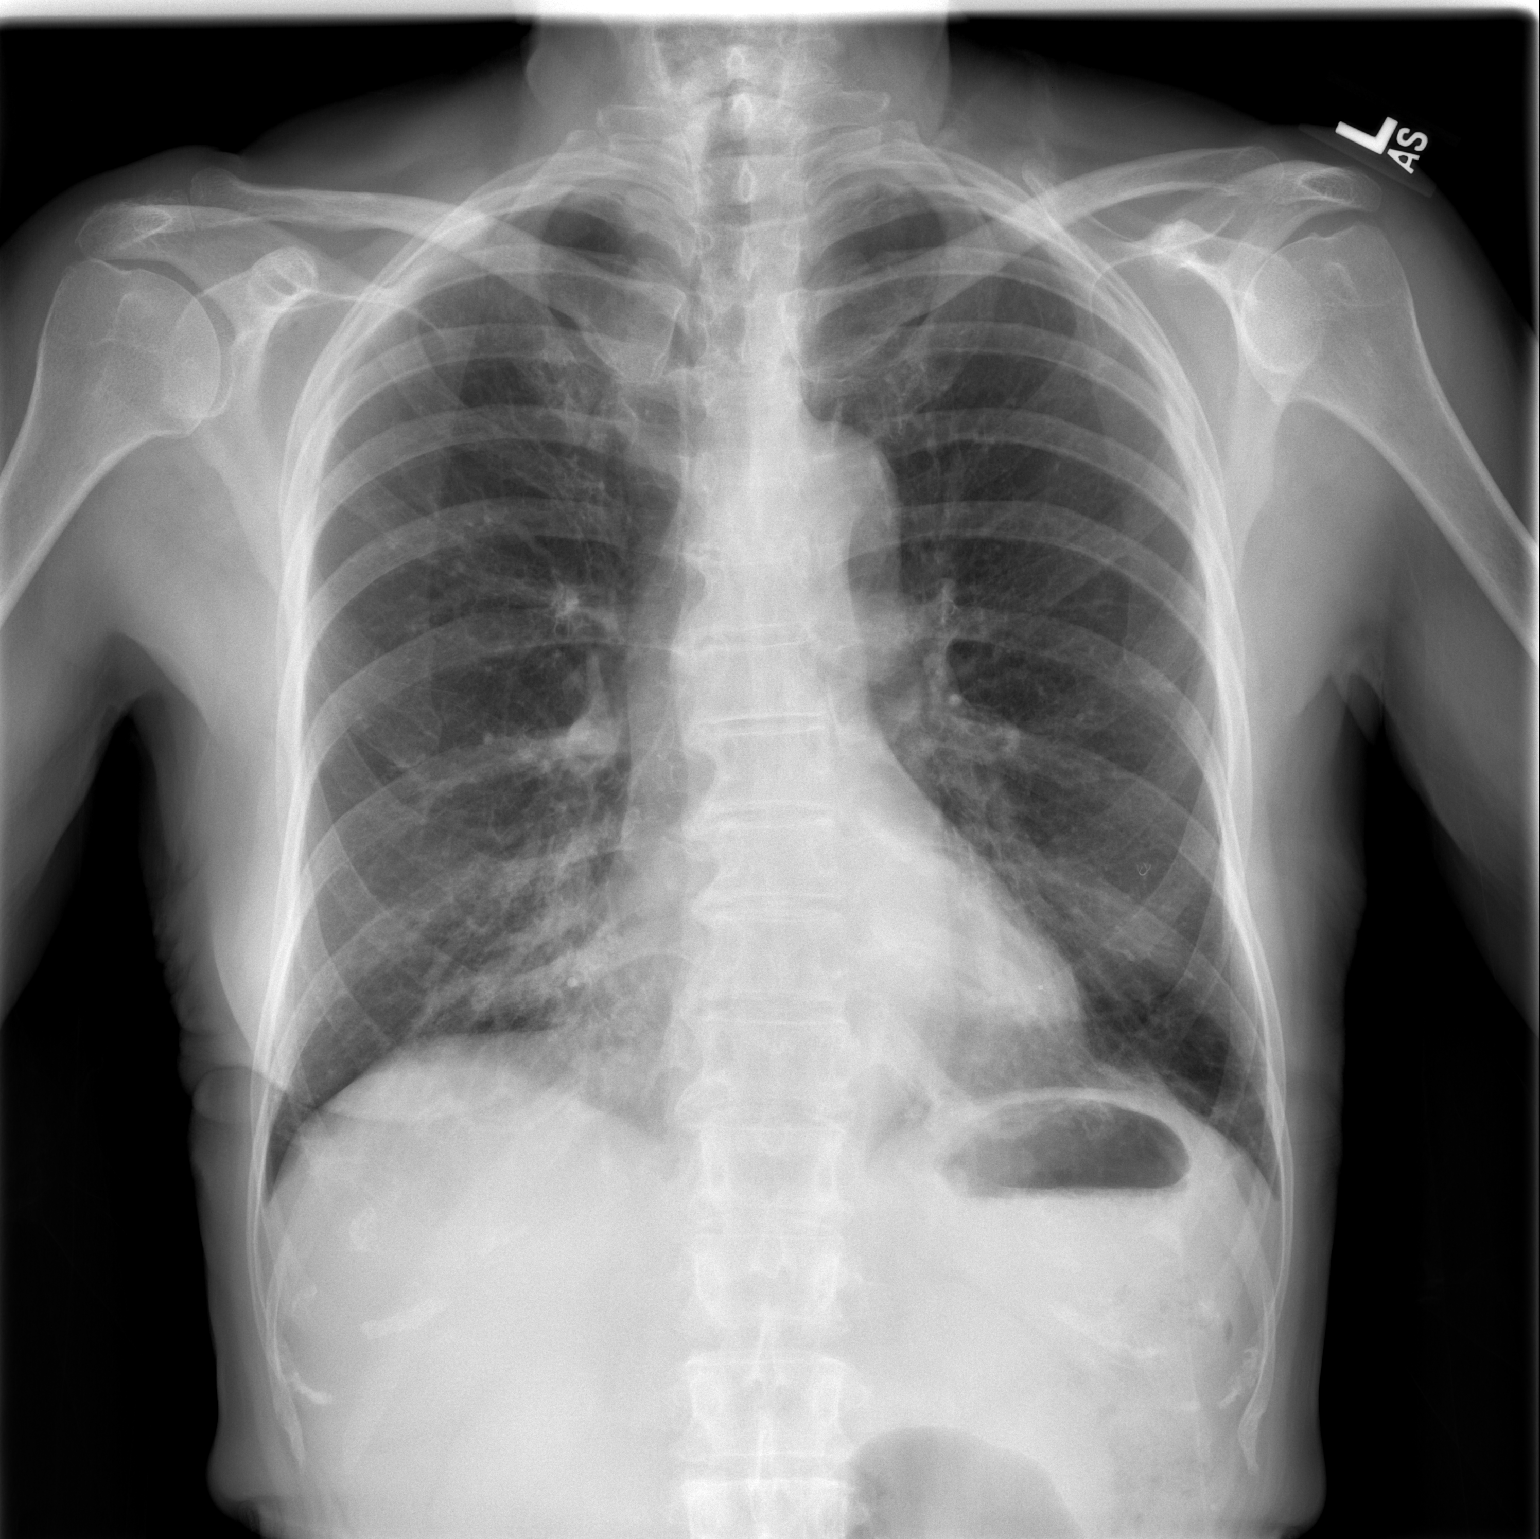

[w chest lat]
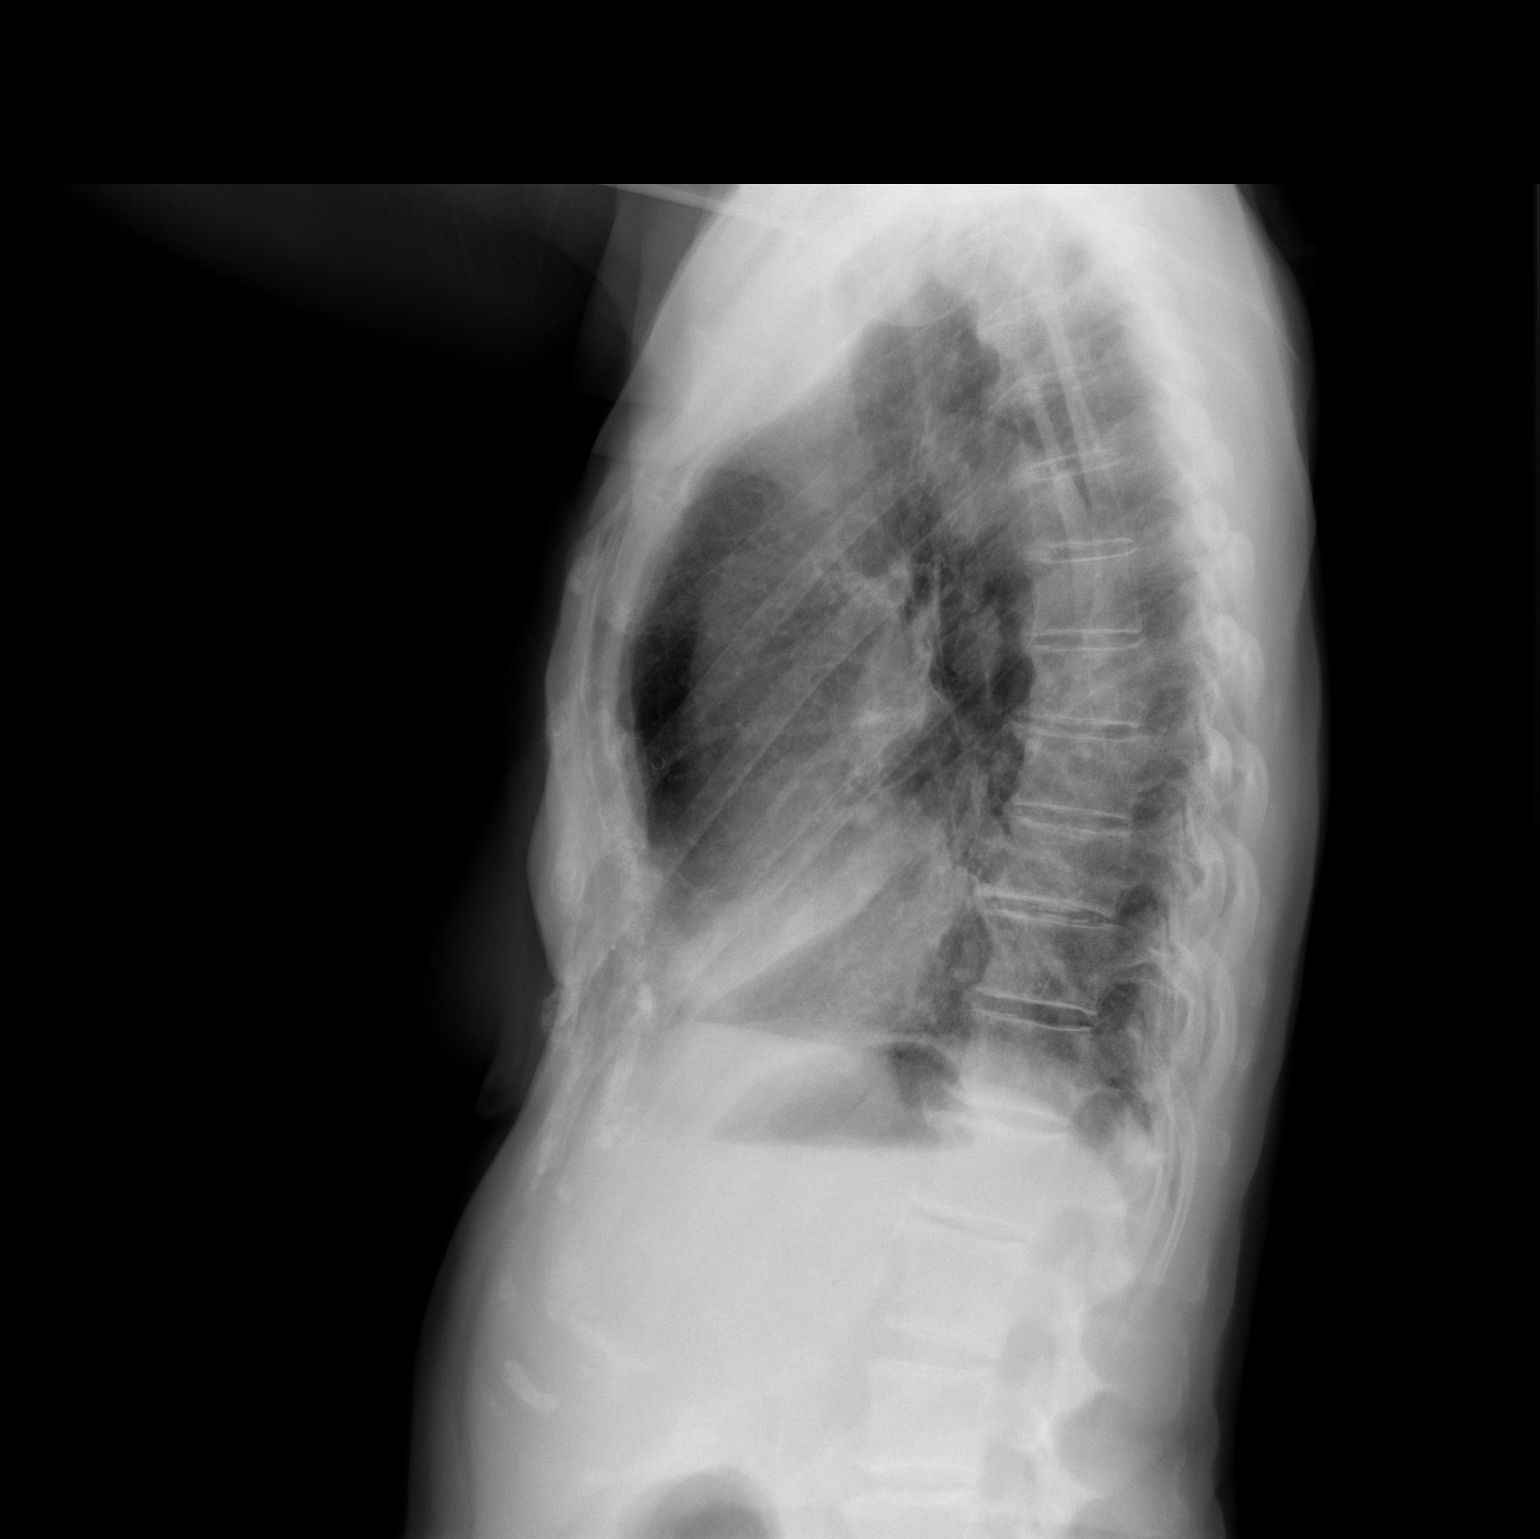

[2 of 2 positions shown; findings below may reference images not displayed]

FINDINGS: There is vague opacity anteriorly on the lateral view
which may represent right middle lobe or lingular atelectasis or
pneumonia.  Follow-up chest x-ray is recommended. No effusion is
seen.  Heart size is stable.  No bony abnormality is noted.
IMPRESSION: Vague opacity anteriorly on the lateral view consistent with right
middle lobe and/or lingular atelectasis or pneumonia.  Recommend
follow-up.

## 2011-04-23 ENCOUNTER — Telehealth: Payer: Self-pay | Admitting: *Deleted

## 2011-04-23 NOTE — Telephone Encounter (Signed)
approved per provider to make an open slot for another patient undertreatment

## 2011-04-24 ENCOUNTER — Ambulatory Visit: Payer: Medicare Other | Admitting: Oncology

## 2011-06-02 ENCOUNTER — Other Ambulatory Visit: Payer: Medicare Other

## 2011-06-03 ENCOUNTER — Ambulatory Visit
Admission: RE | Admit: 2011-06-03 | Discharge: 2011-06-03 | Disposition: A | Discharge status: Home / Self Care | Payer: Medicare Other | Source: Ambulatory Visit | Attending: Oncology | Admitting: Oncology

## 2011-06-03 DIAGNOSIS — Z853 Personal history of malignant neoplasm of breast: Secondary | ICD-10-CM

## 2011-06-04 ENCOUNTER — Other Ambulatory Visit: Payer: Medicare Other | Admitting: Lab

## 2011-06-04 ENCOUNTER — Ambulatory Visit: Payer: Medicare Other | Admitting: Oncology

## 2011-06-05 ENCOUNTER — Ambulatory Visit (HOSPITAL_BASED_OUTPATIENT_CLINIC_OR_DEPARTMENT_OTHER): Payer: Medicare Other | Admitting: Oncology

## 2011-06-05 ENCOUNTER — Telehealth: Payer: Self-pay | Admitting: Oncology

## 2011-06-05 ENCOUNTER — Other Ambulatory Visit (HOSPITAL_BASED_OUTPATIENT_CLINIC_OR_DEPARTMENT_OTHER): Payer: Medicare Other | Admitting: Lab

## 2011-06-05 VITALS — BP 176/68 | HR 82 | Temp 97.8°F | Ht 65.0 in | Wt 122.4 lb

## 2011-06-05 DIAGNOSIS — Z5181 Encounter for therapeutic drug level monitoring: Secondary | ICD-10-CM

## 2011-06-05 DIAGNOSIS — C50419 Malignant neoplasm of upper-outer quadrant of unspecified female breast: Secondary | ICD-10-CM

## 2011-06-05 DIAGNOSIS — E559 Vitamin D deficiency, unspecified: Secondary | ICD-10-CM

## 2011-06-05 DIAGNOSIS — C50919 Malignant neoplasm of unspecified site of unspecified female breast: Secondary | ICD-10-CM

## 2011-06-05 LAB — CBC WITH DIFFERENTIAL/PLATELET
BASO%: 0.7 % (ref 0.0–2.0)
Eosinophils Absolute: 0.2 10*3/uL (ref 0.0–0.5)
HCT: 39.8 % (ref 34.8–46.6)
MCHC: 33.7 g/dL (ref 31.5–36.0)
MONO#: 0.6 10*3/uL (ref 0.1–0.9)
NEUT#: 3.5 10*3/uL (ref 1.5–6.5)
RBC: 4.16 10*6/uL (ref 3.70–5.45)
WBC: 6 10*3/uL (ref 3.9–10.3)
lymph#: 1.6 10*3/uL (ref 0.9–3.3)
nRBC: 0 % (ref 0–0)

## 2011-06-05 LAB — COMPREHENSIVE METABOLIC PANEL
ALT: 13 U/L (ref 0–35)
AST: 19 U/L (ref 0–37)
Albumin: 3.9 g/dL (ref 3.5–5.2)
CO2: 27 mEq/L (ref 19–32)
Calcium: 9.7 mg/dL (ref 8.4–10.5)
Chloride: 108 mEq/L (ref 96–112)
Potassium: 4.2 mEq/L (ref 3.5–5.3)
Sodium: 141 mEq/L (ref 135–145)
Total Protein: 6.5 g/dL (ref 6.0–8.3)

## 2011-06-05 LAB — CANCER ANTIGEN 27.29: CA 27.29: 18 U/mL (ref 0–39)

## 2011-06-05 LAB — LACTATE DEHYDROGENASE: LDH: 122 U/L (ref 94–250)

## 2011-06-05 NOTE — Progress Notes (Signed)
Hematology and Oncology Follow Up Visit  Erica Conner [id number] [birth year] 78 y.o. 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  No diagnosis found.   PAST THERAPY: as above    Interim History:  She has been feeling well, without complaint. Medications have not changed , followed by dr nyland.  Medications: I have reviewed the patient's current medications.  Allergies:  Allergies  Allergen Reactions  . Penicillins Hives  . Sulfa Antibiotics Hives    Past Medical History, Surgical history, Social history, and Family History were reviewed and updated.  Review of Systems: Constitutional:  Negative for fever, chills, night sweats, anorexia, weight loss, pain. Cardiovascular: no chest pain or dyspnea on exertion Respiratory: no cough, shortness of breath, or wheezing Neurological: no TIA or stroke symptoms Dermatological: negative ENT: negative Skin Gastrointestinal: negative Genito-Urinary: negative Hematological and Lymphatic: negative Breast: negative Musculoskeletal: negative Remaining ROS negative.  Physical Exam:  Blood pressure 176/68, pulse 82, temperature 97.8 F (36.6 C), temperature source Oral, height 5\' 5"  (1.651 m), weight 122 lb 6.4 oz (55.52 kg).  ECOG:  0  General appearance: alert, cooperative and appears stated age Throat: lips, mucosa, and tongue normal; teeth and gums normal Resp: clear to auscultation bilaterally and normal percussion bilaterally Chest Resor: no tenderness Breasts: normal appearance, no masses or tenderness, s/p mrm No evidence of local recurrence. Cardio: regular rate and rhythm, S1, S2 normal, no murmur, click, rub or gallop and normal apical impulse GI: soft, non-tender; bowel sounds normal; no masses,  no organomegaly Extremities: extremities normal, atraumatic,  no cyanosis or edema Pulses: 2+ and symmetric Lymph nodes: Cervical, supraclavicular, and axillary nodes normal. Neurologic: Grossly normal   Lab Results: Lab Results  Component Value Date   WBC 6.0 06/05/2011   HGB 13.4 06/05/2011   HCT 39.8 06/05/2011   MCV 95.6 06/05/2011   PLT 256 06/05/2011     Chemistry      Component Value Date/Time   NA 142 10/24/2010 0936   NA 142 10/24/2010 0936   K 3.8 10/24/2010 0936   K 3.8 10/24/2010 0936   CL 107 10/24/2010 0936   CL 107 10/24/2010 0936   CO2 24 10/24/2010 0936   CO2 24 10/24/2010 0936   BUN 16 10/24/2010 0936   BUN 16 10/24/2010 0936   CREATININE 0.85 10/24/2010 0936   CREATININE 0.85 10/24/2010 0936      Component Value Date/Time   CALCIUM 9.8 10/24/2010 0936   CALCIUM 9.8 10/24/2010 0936   ALKPHOS 28* 10/24/2010 0936   ALKPHOS 28* 10/24/2010 0936   AST 21 10/24/2010 0936   AST 21 10/24/2010 0936   ALT 12 10/24/2010 0936   ALT 12 10/24/2010 0936   BILITOT 0.4 10/24/2010 0936   BILITOT 0.4 10/24/2010 0936       Radiological Studies:  Dg Bone Density  06/03/2011  *RADIOLOGY REPORT*  Clinical Data: 78 year old postmenopausal female with history of breast cancer, heart disease and high blood pressure.  The patient takes calcium, vitamin D and Femara.  DUAL X-RAY ABSORPTIOMETRY (DXA) FOR BONE MINERAL DENSITY  AP LUMBAR SPINE (L1 - L4)  Bone Mineral Density (BMD):            1.189 g/cm2 Young Adult T Score:  1.3 Z Score:                                                4.1  LEFT FEMUR (NECK)  Bone Mineral Density (BMD):             0.769 g/cm2 Young Adult T Score:                           -0.7 Z Score:                                                 1.7  ASSESSMENT:  Patient's diagnostic category is NORMAL by WHO Criteria.  FRACTURE RISK: NOT INCREASED  FRAX: Not applicable  Comparison: There has been no significant change in BMD in the spine or total left hip as compared to baseline study dated  05/20/2007.  There is been a statistically significant 3.6% increase in BMD in the spine and no significant change in BMD in the total left hip as compared to 05/31/2009.  RECOMMENDATIONS:  Effective therapies are available in the form of bisphosphonates, selective estrogen receptor modulators, biologic agents, and hormone replacement therapy (for women).  All patients should ensure an adequate intake of dietary calcium (1200mg  daily) and vitamin D (800 IU daily) unless contraindicated.  All treatment decisions require clinical judgement and consideration of individual patient factors, including patient preferences, co-morbidities, previous drug use, risk factors not captured in the FRAX model (e.g., frailty, falls, vitamin D deficiency, increased bone turnover, interval significant decline in bone density) and possible under-or over-estimation of fracture risk by FRAX.  The National Osteoporosis Foundation recommends that FDA-approved medical therapies be considered in postmenopausal women and mean age 38 or older with a:        1)     Hip or vertebral (clinical or morphometric) fracture.           2)    T-score of -2.5 or lower at the spine or hip. 3)    Ten-year fracture probability by FRAX of 3% or greater for hip fracture or 20% or greater for major osteoporotic fracture. FOLLOW-UP:  People with diagnosed cases of osteoporosis or at high risk for fracture should have regular bone mineral density tests.  For patients eligible for Medicare, routine testing is allowed once every 2 years.  The testing frequency can be increased to one year for patients who have rapidly progressing disease, those who are receiving or discontinuing medical therapy to restore bone mass, or have additional risk factors.  World Science writer [facility]) Criteria:  Normal: T scores from +1.0 to -1.0 Low Bone Mass (Osteopenia): T scores between -1.0 and -2.5 Osteoporosis: T scores -2.5 and below  Comparison to Reference Population:  T  score is the key measure used in the diagnosis of osteoporosis and relative risk determination for fracture.  It provides a value for bone mass relative to the mean bone mass of a young adult reference population expressed in terms of standard deviation (SD).  Z score is the age-matched score showing the patient's values compared to a population matched for age, sex, and race.  This is also expressed in terms of standard deviation.  The patient may have values that compare favorably to the age-matched values and still be at increased risk for fracture.  Original Report Authenticated By: Daryl Eastern, M.D.     IMPRESSIONS AND PLAN: A 78 y.o. female with history of breast cancer, now in her 4th yr of letrozole therapy. She is clinically free of disease. She will have her mammogram in July 2013, and her most recent bone density is wnl. I will see her in 6 months.     Spent more than half the time coordinating care.    Shafer Swamy 5/30/20133:19 PM

## 2011-06-05 NOTE — Telephone Encounter (Signed)
gve the pt her dec 2013 appt calendar °

## 2011-06-30 ENCOUNTER — Other Ambulatory Visit: Payer: Self-pay | Admitting: Oncology

## 2011-06-30 DIAGNOSIS — Z9012 Acquired absence of left breast and nipple: Secondary | ICD-10-CM

## 2011-06-30 DIAGNOSIS — Z1231 Encounter for screening mammogram for malignant neoplasm of breast: Secondary | ICD-10-CM

## 2011-07-21 ENCOUNTER — Ambulatory Visit
Admission: RE | Admit: 2011-07-21 | Discharge: 2011-07-21 | Disposition: A | Discharge status: Home / Self Care | Payer: Medicare Other | Source: Ambulatory Visit | Attending: Oncology | Admitting: Oncology

## 2011-07-21 DIAGNOSIS — Z9012 Acquired absence of left breast and nipple: Secondary | ICD-10-CM

## 2011-07-21 DIAGNOSIS — Z1231 Encounter for screening mammogram for malignant neoplasm of breast: Secondary | ICD-10-CM

## 2011-11-20 ENCOUNTER — Other Ambulatory Visit: Payer: Self-pay | Admitting: Oncology

## 2011-12-09 ENCOUNTER — Ambulatory Visit (HOSPITAL_BASED_OUTPATIENT_CLINIC_OR_DEPARTMENT_OTHER): Payer: Medicare Other | Admitting: Oncology

## 2011-12-09 ENCOUNTER — Telehealth: Payer: Self-pay | Admitting: Oncology

## 2011-12-09 ENCOUNTER — Other Ambulatory Visit (HOSPITAL_BASED_OUTPATIENT_CLINIC_OR_DEPARTMENT_OTHER): Payer: Medicare Other

## 2011-12-09 VITALS — BP 170/72 | HR 75 | Temp 97.5°F | Resp 20 | Ht 65.0 in | Wt 118.8 lb

## 2011-12-09 DIAGNOSIS — C50919 Malignant neoplasm of unspecified site of unspecified female breast: Secondary | ICD-10-CM

## 2011-12-09 DIAGNOSIS — C50419 Malignant neoplasm of upper-outer quadrant of unspecified female breast: Secondary | ICD-10-CM

## 2011-12-09 DIAGNOSIS — Z170 Estrogen receptor positive status [ER+]: Secondary | ICD-10-CM

## 2011-12-09 DIAGNOSIS — E559 Vitamin D deficiency, unspecified: Secondary | ICD-10-CM

## 2011-12-09 LAB — CBC WITH DIFFERENTIAL/PLATELET
BASO%: 0.6 % (ref 0.0–2.0)
Basophils Absolute: 0 10*3/uL (ref 0.0–0.1)
EOS%: 1.2 % (ref 0.0–7.0)
HCT: 41.1 % (ref 34.8–46.6)
HGB: 14.1 g/dL (ref 11.6–15.9)
LYMPH%: 29.4 % (ref 14.0–49.7)
MCH: 32.7 pg (ref 25.1–34.0)
MCHC: 34.3 g/dL (ref 31.5–36.0)
MCV: 95.5 fL (ref 79.5–101.0)
NEUT%: 57.5 % (ref 38.4–76.8)
Platelets: 268 10*3/uL (ref 145–400)

## 2011-12-09 LAB — COMPREHENSIVE METABOLIC PANEL (CC13)
ALT: 14 U/L (ref 0–55)
AST: 20 U/L (ref 5–34)
Albumin: 3.4 g/dL — ABNORMAL LOW (ref 3.5–5.0)
Alkaline Phosphatase: 32 U/L — ABNORMAL LOW (ref 40–150)
BUN: 18 mg/dL (ref 7.0–26.0)
Calcium: 10 mg/dL (ref 8.4–10.4)
Chloride: 107 mEq/L (ref 98–107)
Potassium: 4.2 mEq/L (ref 3.5–5.1)

## 2011-12-09 MED ORDER — LETROZOLE 2.5 MG PO TABS: 2.5000 mg | ORAL_TABLET | Freq: Every day | ORAL | Status: DC

## 2011-12-09 NOTE — Progress Notes (Signed)
Hematology and Oncology Follow Up Visit  Erica Conner [id number] 1926-12-29 78 y.o. 12/09/2011 12:40 PM   DIAGNOSIS: er+ breast cancer on neoadjuvant letrozole for 1 yr followed by surgery 6/10 for T1CN1 er+ breast cancer followed by chest Curiale xrt completed 09/06/2008, on onoing letrozole.Last mammogram 7/13 wnl. Bone density excellent.  No diagnosis found.   PAST THERAPY: as above    Interim History:  She has been feeling well, without complaint. Medications have not changed , followed by dr [email or web address]  Medications: I have reviewed the patient's current medications.  Allergies:  Allergies  Allergen Reactions  . Penicillins Hives  . Sulfa Antibiotics Hives    Past Medical History, Surgical history, Social history, and Family History were reviewed and updated.  Review of Systems: Constitutional:  Negative for fever, chills, night sweats, anorexia, weight loss, pain. Cardiovascular: no chest pain or dyspnea on exertion Respiratory: no cough, shortness of breath, or wheezing Neurological: no TIA or stroke symptoms Dermatological: negative ENT: negative Skin Gastrointestinal: negative Genito-Urinary: negative Hematological and Lymphatic: negative Breast: negative Musculoskeletal: negative Remaining ROS negative.  Physical Exam:  Blood pressure 170/72, pulse 75, temperature 97.5 F (36.4 C), resp. rate 20, height 5\' 5"  (1.651 m), weight 118 lb 12.8 oz (53.887 kg).  ECOG:  0  General appearance: alert, cooperative and appears stated age Throat: lips, mucosa, and tongue normal; teeth and gums normal Resp: clear to auscultation bilaterally and normal percussion bilaterally Chest Bendix: no tenderness Breasts: normal appearance, no masses or tenderness, s/p mrm No evidence of local recurrence. Cardio: regular rate and rhythm, S1, S2 normal, no murmur, click, rub or gallop and normal apical impulse GI: soft, non-tender; bowel sounds normal; no masses,  no  organomegaly Extremities: extremities normal, atraumatic, no cyanosis or edema Pulses: 2+ and symmetric Lymph nodes: Cervical, supraclavicular, and axillary nodes normal. Neurologic: Grossly normal   Lab Results: Lab Results  Component Value Date   WBC 6.3 12/09/2011   HGB 14.1 12/09/2011   HCT 41.1 12/09/2011   MCV 95.5 12/09/2011   PLT 268 12/09/2011     Chemistry      Component Value Date/Time   NA 141 12/09/2011 1101   NA 141 06/05/2011 1425   K 4.2 12/09/2011 1101   K 4.2 06/05/2011 1425   CL 107 12/09/2011 1101   CL 108 06/05/2011 1425   CO2 26 12/09/2011 1101   CO2 27 06/05/2011 1425   BUN 18.0 12/09/2011 1101   BUN 16 06/05/2011 1425   CREATININE 0.7 12/09/2011 1101   CREATININE 0.79 06/05/2011 1425      Component Value Date/Time   CALCIUM 10.0 12/09/2011 1101   CALCIUM 9.7 06/05/2011 1425   ALKPHOS 32* 12/09/2011 1101   ALKPHOS 26* 06/05/2011 1425   AST 20 12/09/2011 1101   AST 19 06/05/2011 1425   ALT 14 12/09/2011 1101   ALT 13 06/05/2011 1425   BILITOT 0.43 12/09/2011 1101   BILITOT 0.4 06/05/2011 1425       Radiological Studies:  Dg Bone Density  06/03/2011  *RADIOLOGY REPORT*  Clinical Data: 78 year old postmenopausal female with history of breast cancer, heart disease and high blood pressure.  The patient takes calcium, vitamin D and Femara.  DUAL X-RAY ABSORPTIOMETRY (DXA) FOR BONE MINERAL DENSITY  AP LUMBAR SPINE (L1 - L4)  Bone Mineral Density (BMD):            1.189 g/cm2 Young Adult T Score:  1.3 Z Score:                                                4.1  LEFT FEMUR (NECK)  Bone Mineral Density (BMD):             0.769 g/cm2 Young Adult T Score:                           -0.7 Z Score:                                                 1.7  ASSESSMENT:  Patient's diagnostic category is NORMAL by WHO Criteria.  FRACTURE RISK: NOT INCREASED  FRAX: Not applicable  Comparison: There has been no significant change in BMD in the spine or total left hip as  compared to baseline study dated 05/20/2007.  There is been a statistically significant 3.6% increase in BMD in the spine and no significant change in BMD in the total left hip as compared to 05/31/2009.  RECOMMENDATIONS:  Effective therapies are available in the form of bisphosphonates, selective estrogen receptor modulators, biologic agents, and hormone replacement therapy (for women).  All patients should ensure an adequate intake of dietary calcium (1200mg  daily) and vitamin D (800 IU daily) unless contraindicated.  All treatment decisions require clinical judgement and consideration of individual patient factors, including patient preferences, co-morbidities, previous drug use, risk factors not captured in the FRAX model (e.g., frailty, falls, vitamin D deficiency, increased bone turnover, interval significant decline in bone density) and possible under-or over-estimation of fracture risk by FRAX.  The National Osteoporosis Foundation recommends that FDA-approved medical therapies be considered in postmenopausal women and mean age 74 or older with a:        1)     Hip or vertebral (clinical or morphometric) fracture.           2)    T-score of -2.5 or lower at the spine or hip. 3)    Ten-year fracture probability by FRAX of 3% or greater for hip fracture or 20% or greater for major osteoporotic fracture. FOLLOW-UP:  People with diagnosed cases of osteoporosis or at high risk for fracture should have regular bone mineral density tests.  For patients eligible for Medicare, routine testing is allowed once every 2 years.  The testing frequency can be increased to one year for patients who have rapidly progressing disease, those who are receiving or discontinuing medical therapy to restore bone mass, or have additional risk factors.  World Science writer [facility]) Criteria:  Normal: T scores from +1.0 to -1.0 Low Bone Mass (Osteopenia): T scores between -1.0 and -2.5 Osteoporosis: T scores -2.5 and below   Comparison to Reference Population:  T score is the key measure used in the diagnosis of osteoporosis and relative risk determination for fracture.  It provides a value for bone mass relative to the mean bone mass of a young adult reference population expressed in terms of standard deviation (SD).  Z score is the age-matched score showing the patient's values compared to a population matched for age, sex, and race.  This is also expressed in terms of standard deviation.  The patient may have values that compare favorably to the age-matched values and still be at increased risk for fracture.  Original Report Authenticated By: Daryl Eastern, M.D.     IMPRESSIONS AND PLAN: A 78 y.o. female with history of breast cancer, now in her 4th yr of letrozole therapy. She is clinically free of disease. She will have her mammogram in July 2014, and her most recent bone density is wnl. I will see her in 6 months. We discussed staying on letrozole longer than 5 yrs given her lack of side effects and normal bone density.   Spent more than half the time coordinating care.    Grier Vu 12/3/201312:40 PM

## 2011-12-09 NOTE — Telephone Encounter (Signed)
gve the pt her June 2014 appt calendar 

## 2012-01-18 ENCOUNTER — Other Ambulatory Visit: Payer: Self-pay | Admitting: Oncology

## 2012-03-27 ENCOUNTER — Telehealth: Payer: Self-pay | Admitting: *Deleted

## 2012-03-27 ENCOUNTER — Encounter: Payer: Self-pay | Admitting: Oncology

## 2012-03-27 NOTE — Telephone Encounter (Signed)
sw pt gv appts d/t.the patient is aware

## 2012-06-11 ENCOUNTER — Other Ambulatory Visit: Payer: Medicare Other | Admitting: Lab

## 2012-06-11 ENCOUNTER — Ambulatory Visit: Payer: Medicare Other | Admitting: Oncology

## 2012-06-17 ENCOUNTER — Ambulatory Visit: Payer: Medicare Other | Admitting: Oncology

## 2012-06-17 ENCOUNTER — Other Ambulatory Visit: Payer: Medicare Other | Admitting: Lab

## 2012-06-17 ENCOUNTER — Ambulatory Visit: Payer: Medicare Other | Admitting: Family

## 2012-06-18 ENCOUNTER — Other Ambulatory Visit: Payer: Self-pay | Admitting: Family

## 2012-06-18 DIAGNOSIS — Z853 Personal history of malignant neoplasm of breast: Secondary | ICD-10-CM

## 2012-06-21 ENCOUNTER — Other Ambulatory Visit (HOSPITAL_BASED_OUTPATIENT_CLINIC_OR_DEPARTMENT_OTHER): Payer: Medicare Other | Admitting: Lab

## 2012-06-21 ENCOUNTER — Encounter: Payer: Self-pay | Admitting: Family

## 2012-06-21 ENCOUNTER — Ambulatory Visit (HOSPITAL_BASED_OUTPATIENT_CLINIC_OR_DEPARTMENT_OTHER): Payer: Medicare Other | Admitting: Family

## 2012-06-21 VITALS — BP 167/67 | HR 74 | Temp 98.1°F | Resp 20 | Ht 65.0 in | Wt 118.0 lb

## 2012-06-21 DIAGNOSIS — Z853 Personal history of malignant neoplasm of breast: Secondary | ICD-10-CM | POA: Insufficient documentation

## 2012-06-21 DIAGNOSIS — E559 Vitamin D deficiency, unspecified: Secondary | ICD-10-CM

## 2012-06-21 LAB — CBC WITH DIFFERENTIAL/PLATELET
BASO%: 0.8 % (ref 0.0–2.0)
Basophils Absolute: 0.1 10*3/uL (ref 0.0–0.1)
EOS%: 1.5 % (ref 0.0–7.0)
HCT: 42.1 % (ref 34.8–46.6)
HGB: 14.6 g/dL (ref 11.6–15.9)
MCH: 32.6 pg (ref 25.1–34.0)
MCHC: 34.6 g/dL (ref 31.5–36.0)
MCV: 94.1 fL (ref 79.5–101.0)
MONO%: 9.1 % (ref 0.0–14.0)
NEUT%: 58.5 % (ref 38.4–76.8)
RDW: 13.2 % (ref 11.2–14.5)

## 2012-06-21 LAB — COMPREHENSIVE METABOLIC PANEL (CC13)
ALT: 12 U/L (ref 0–55)
Albumin: 3.5 g/dL (ref 3.5–5.0)
CO2: 26 mEq/L (ref 22–29)
Calcium: 10.5 mg/dL — ABNORMAL HIGH (ref 8.4–10.4)
Chloride: 107 mEq/L (ref 98–107)
Glucose: 100 mg/dl — ABNORMAL HIGH (ref 70–99)
Potassium: 4.3 mEq/L (ref 3.5–5.1)
Sodium: 142 mEq/L (ref 136–145)
Total Protein: 7.1 g/dL (ref 6.4–8.3)

## 2012-06-21 LAB — LACTATE DEHYDROGENASE (CC13): LDH: 156 U/L (ref 125–245)

## 2012-06-21 NOTE — Progress Notes (Addendum)
[facility]  Telephone:([phone] Fax:([phone]) [phone]  OFFICE PROGRESS NOTE   ID: Ernestine Rohman Goldie   DOB: 02-19-1926  MR#: [id number]  ZHY#:[id number]   PCP: Josue Hector, MD SU: Sandria Bales.  Ezzard Standing, M.D. RAD ONC: Lurline Hare, M.D.   HISTORY OF PRESENT ILLNESS: From Dr. Theron Arista Rubin's new patient evaluation note dated 05/18/2007: "This is a delightful 79 year old woman from [place], [place] referred by Dr. Ezzard Standing for evaluation and treatment of breast cancer. This woman has undergone annual screening mammography.  She relates having noted some firmness and tenderness and some nodules within the left breast over the past four to six months.  She underwent screening mammography on 04/27/07, which showed no mammographic evidence of malignancy.  Further evaluation was recommended.  A left breast ultrasound performed on 05/05/07 showed a mass involving the left breast, replacing the majority of inferior left breast with overlying skin thickness, redness all suggestive of malignancy, no abnormal axillary lymph nodes were identified.  The patient underwent biopsies of two separate nodules on 05/05/07, both of which showed invasive mammary carcinoma.  The lesion at 2:30 showed ductal cancer with lobular features.  The other lesion was low, intermediate grade ductal cancer with lobular features.  Both tumors were strongly ER and PR positive at 98% and 99% respectively.  Ki-67 was 10% and 8% respectively, HER-2 was 2+ one lesion, was 1+ in the other lesion.  FISH is pending.  MRI scan was scheduled and the results are pending as well."  Her subsequent history is as detailed below.   INTERVAL HISTORY: Dr. Darnelle Catalan and I saw Norena Bratton Mulkern today for followup of multifocal invasive ductal carcinoma of the left breast.  She is accompanied for today's office visit by her daughter-in-law Francene Finders. The patient was last seen by Dr. Donnie Coffin on 12/09/2011.  Since her last office  visit, the patient has been doing relatively well.  She is establishing herself with Dr. Darrall Dears service today.  REVIEW OF SYSTEMS: A 10 point review of systems was completed and is negative except occasional constipation.  The patient denies any other symptomatology.   PAST MEDICAL HISTORY: Past Medical History  Diagnosis Date  . Breast cancer 04/2007    Left  . SVT (supraventricular tachycardia)   . Syncope   . Hypothyroid   . Hypertension   . Hyperlipidemia   . Ankle fracture, right 2000  . Cataracts, bilateral   Patient has a history of tachyrhythmia, SVT with history of syncope diagnosed in 2002 under the care of Dr. Deborah Chalk.     PAST SURGICAL HISTORY: Past Surgical History  Procedure Laterality Date  . Abdominal hysterectomy    . Breast cyst excision  1987  . Mastectomy Left 06/2008  . Tonsillectomy    . Appendectomy    . Cataract extraction Bilateral 2011  Includes hysterectomy and oophorectomy in 1979, after which she was placed on hormone replacement therapy.  She previously was under the care of Dr. Rozetta Nunnery who removed a cyst from her left breast in 1987.   FAMILY HISTORY Family History  Problem Relation Age of Onset  . Hypertension Mother   . Heart disease Mother   . Heart disease Father   . Heart attack Father   . Cancer Sister     Pancreatic and liver cancer  . Cancer Brother     Colon cancer  . Cancer Other     Breast cancer  Both parents deceased; mother from complications of hypertension and coronary  artery disease, father from complications of myocardial infarction.  She has three full siblings; sister died of liver and pancreatic cancer, brother died of colon cancer.  There is a niece, sister's daughter, who had breast cancer as noted.   GYNECOLOGIC HISTORY: She is gravida 2, para 2, menarche age 54, menopause at the time of hysterectomy at age 69, again hormone replacement therapy from 57 to 2002.  SOCIAL HISTORY: Mrs. Therrell has been  married twice.  She married her first husband in 26.  She married her second husband in 1964. Patient is widowed after being married to her second husband for 42 years.  She lives alone in [place].  She has 2 adult children. Her son Loraine Leriche who lives in [place] is also her medical power of attorney.  Her daughter Drinda Butts lives in [place].  She has 4 grandchildren.  In her spare time she enjoys reading, attending [address] and watching soap operas.  ADVANCED DIRECTIVES: In place  HEALTH MAINTENANCE: History  Substance Use Topics  . Smoking status: Never Smoker   . Smokeless tobacco: Never Used  . Alcohol Use: No    Colonoscopy: Not on file PAP: Not on file Bone density:  The patient's last bone density scan on 06/03/2011 showed a T score of 1.3 (normal). Lipid panel: Not on file   Allergies  Allergen Reactions  . Penicillins Hives  . Sulfa Antibiotics Hives    Current Outpatient Prescriptions  Medication Sig Dispense Refill  . aspirin 81 MG tablet Take 81 mg by mouth daily.      Marland Kitchen atorvastatin (LIPITOR) 40 MG tablet Take 40 mg by mouth daily.      . calcium carbonate 200 MG capsule Take 250 mg by mouth daily.      . Cholecalciferol (VITAMIN D) 2000 UNITS tablet Take 2,000 Units by mouth daily.      . ciprofloxacin (CILOXAN) 0.3 % ophthalmic solution Place 1 drop into both eyes 3 (three) times daily.       . digoxin (LANOXIN) 0.25 MG tablet Take 250 mcg by mouth daily.      . fenofibrate 160 MG tablet Take 160 mg by mouth daily.      . fish oil-omega-3 fatty acids 1000 MG capsule Take 2 g by mouth 2 (two) times daily.      Marland Kitchen levothyroxine (SYNTHROID, LEVOTHROID) 88 MCG tablet Take 88 mcg by mouth daily before breakfast.      . Multiple Vitamin (MULTIVITAMIN) tablet Take 1 tablet by mouth daily.      . verapamil (CALAN-SR) 240 MG CR tablet Take 240 mg by mouth at bedtime.       No current facility-administered medications for this visit.    OBJECTIVE: Filed  Vitals:   06/21/12 1027  BP: 167/67  Pulse: 74  Temp: 98.1 F (36.7 C)  Resp: 20     Body mass index is 19.64 kg/(m^2).      ECOG FS: 1 - Symptomatic but completely ambulatory  General appearance: Alert, cooperative, thin frame, frail, no apparent distress Head: Normocephalic, without obvious abnormality, atraumatic, hearing aids Eyes: Arcus senilis, PERRLA, EOMI Nose: Nares, septum and mucosa are normal, no drainage or sinus tenderness Neck: No adenopathy, supple, symmetrical, trachea midline, thyroid not enlarged, no tenderness Resp: Clear to auscultation bilaterally Cardio: Regular rate and rhythm, S1, S2 normal, no murmur, click, rub or gallop Breasts: Left breast is surgically absent, left chest Kubicek area and right breast have well-healed surgical scars, no lymphadenopathy, no  right breast nipple inversion, no axilla fullness, firm right medial mammary ridge GI: Soft, slightly distended, non-tender, hypoactive bowel sounds, no organomegaly Extremities: Extremities normal, atraumatic, no cyanosis or edema Lymph nodes: Cervical, supraclavicular, and axillary nodes normal Neurologic: Grossly normal   LAB RESULTS: Lab Results  Component Value Date   WBC 6.1 06/21/2012   NEUTROABS 3.6 06/21/2012   HGB 14.6 06/21/2012   HCT 42.1 06/21/2012   MCV 94.1 06/21/2012   PLT 251 06/21/2012      Chemistry      Component Value Date/Time   NA 142 06/21/2012 0916   NA 141 06/05/2011 1425   K 4.3 06/21/2012 0916   K 4.2 06/05/2011 1425   CL 107 06/21/2012 0916   CL 108 06/05/2011 1425   CO2 26 06/21/2012 0916   CO2 27 06/05/2011 1425   BUN 13.9 06/21/2012 0916   BUN 16 06/05/2011 1425   CREATININE 0.8 06/21/2012 0916   CREATININE 0.79 06/05/2011 1425      Component Value Date/Time   CALCIUM 10.5* 06/21/2012 0916   CALCIUM 9.7 06/05/2011 1425   ALKPHOS 29* 06/21/2012 0916   ALKPHOS 26* 06/05/2011 1425   AST 21 06/21/2012 0916   AST 19 06/05/2011 1425   ALT 12 06/21/2012 0916   ALT 13 06/05/2011  1425   BILITOT 0.51 06/21/2012 0916   BILITOT 0.4 06/05/2011 1425      Lab Results  Component Value Date   LABCA2 18 06/05/2011    Urinalysis No results found for this basename: colorurine,  appearanceur,  labspec,  phurine,  glucoseu,  hgbur,  bilirubinur,  ketonesur,  proteinur,  urobilinogen,  nitrite,  leukocytesur    STUDIES: No results found.  ASSESSMENT: 79 y.o. [place], [place] [facility]) woman:  1.  Status post left breast needle core biopsy at the 2:30 position on 05/05/2007 which showed invasive mammary carcinoma and left breast inferior needle core biopsy which showed invasive mammary carcinoma, estrogen receptor positive 98%, progesterone receptor positive 98%, Ki-67 10%, HER-2/neu 2+ equivocal.  The patient had a bilateral breast MRI on 05/17/2007 which showed extensive abnormal enhancement throughout the left breast consistent with the patient's diagnosis of invasive ductal and invasive lobular carcinoma.  A large portion of the inferior aspect of the left breast as well as the upper outer quadrant of the left breast showed abnormal enhancement.  The enhancement was poorly defined therefore measurements were difficult.  It measured approximately 8.0 x 1.2 x 7.0 cm.  The left breast had a shrunken appearance to it.  The enhancement abuts the pectoralis muscles and involves the nipple.  No abnormal enhancement was seen in the right breast.  There were no enlarged axillary or internal mammary adenopathy identified.   (Clinical stage IIB, T3, N0).  2.  The patient started neoadjuvant antiestrogen therapy with Femara in 05/2007.  3.  Status post left breast simple mastectomy with left axillary sentinel node biopsy and additional inferior margin excision on 06/15/2008 for a stage IIA, ypT1c, ypN1, pMX, 1.2 cm multifocal invasive ductal carcinoma grade 1, estrogen receptor positive 90% and 100%, progesterone receptor positive 98% and 100%, Ki-67 10% and 8%, HER-2/neu by  FISH no amplification , with 1/1 left axillary metastatic lymph nodes.    4.  The patient completed radiation therapy in 09/2008 in [place], [place].  5.  The patient's last bone density scan on 06/03/2011 showed a T score of 1.3 (normal).  6.  The patient's last unilateral right digital screening mammogram on  07/21/2011 showed no specific mammographic evidence of malignancy.    PLAN: The patient is over 5  years from her time of diagnosis and will officially become a graduate of [facility]'s breast cancer program today. The patient's daughter-in-law Francene Finders was present for her graduation ceremony. We asked that she continue annual clinical breast examinations by a physician in addition to annual mammography.  Her last mammogram results are listed above. She is due for her annual unilateral right digital screening mammogram in 07/2012 and states that she already has her mammogram scheduled.  Ms. Groleau knows that she can stop taking antiestrogen therapy with Letrozole at this time.  All questions were answered.  The patient and her daughter-in-law were encouraged to contact us with any problems, questions or concerns.   Larina Bras, NP-C 06/21/2012, 4:27 PM   ADDENDUM: This 79 year old [place] woman establish herself in my practice today. She was diagnosed with left breast cancer through biopsy April of 2009 of a clinical T3 Nx, stage II invasive carcinoma which was estrogen and progesterone receptor positive, with a low proliferation fraction, and no HER-2 amplification.  She was treated neoadjuvant [place] letrozole beginning in May of 2009 and underwent definitive left mastectomy and sentinel lymph node sampling June of 2010. This showed a residual myp T1c N1 invasive ductal carcinoma, low-grade, and she completed her adjuvant radiation treatments in our [facility] office September of 2000.  Alvino Chapel has completed 5 years of antiestrogen therapy. We discussed the fact that we  simply do not have data at this point to continue beyond 5 years. It may be useful to do so or it may be harmful or it may make no difference. In the absence of guidance from randomized data my practice is to stop aromatase inhibitors at 5 years and I am comfortable releasing her to her primary care physician at this point. She understands we will be glad to see her in the future if the need arises but as of now no further appointments have been made for her here.  I personally saw this patient and performed a substantive portion of this encounter with the listed APP documented above.   Lowella Dell, MD

## 2012-06-21 NOTE — Patient Instructions (Addendum)
Please contact us at [phone] if you have any questions or concerns.  Please continue to do well and enjoy life!!!  Get plenty of rest, drink plenty of water, exercise daily, eat a balanced diet.  Continue to take calcium and vitamin D3 daily.   Complete monthly self-breast examinations.  Have a clinical breast exam by a physician every year.  Have your mammogram completed every year.  Results for orders placed in visit on 06/21/12 (from the past 24 hour(s))  CBC WITH DIFFERENTIAL     Status: None   Collection Time    06/21/12  9:16 AM      Result Value Range   WBC 6.1  3.9 - 10.3 10e3/uL   NEUT# 3.6  1.5 - 6.5 10e3/uL   HGB 14.6  11.6 - 15.9 g/dL   HCT 16.1  09.6 - 04.5 %   Platelets 251  145 - 400 10e3/uL   MCV 94.1  79.5 - 101.0 fL   MCH 32.6  25.1 - 34.0 pg   MCHC 34.6  31.5 - 36.0 g/dL   RBC 4.09  8.11 - 9.14 10e6/uL   RDW 13.2  11.2 - 14.5 %   lymph# 1.8  0.9 - 3.3 10e3/uL   MONO# 0.6  0.1 - 0.9 10e3/uL   Eosinophils Absolute 0.1  0.0 - 0.5 10e3/uL   Basophils Absolute 0.1  0.0 - 0.1 10e3/uL   NEUT% 58.5  38.4 - 76.8 %   LYMPH% 30.1  14.0 - 49.7 %   MONO% 9.1  0.0 - 14.0 %   EOS% 1.5  0.0 - 7.0 %   BASO% 0.8  0.0 - 2.0 %   Narrative:    Performed At:  [facility]               [address]               [place], [place] [address]  LACTATE DEHYDROGENASE (CC13)     Status: None   Collection Time    06/21/12  9:16 AM      Result Value Range   LDH 156  125 - 245 U/L   Narrative:    Performed At:  [facility]               [address]               [place], [place] [address]  COMPREHENSIVE METABOLIC PANEL (CC13)     Status: Abnormal   Collection Time    06/21/12  9:16 AM      Result Value Range   Sodium 142  136 - 145 mEq/L   Potassium 4.3  3.5 - 5.1 mEq/L   Chloride 107  98 - 107 mEq/L   CO2 26  22 - 29 mEq/L   Glucose 100 (*) 70 - 99 mg/dl   BUN 86.5  7.0 - 78.4 mg/dL   Creatinine 0.8  0.6 - 1.1 mg/dL   Total Bilirubin 6.96   0.20 - 1.20 mg/dL   Alkaline Phosphatase 29 (*) 40 - 150 U/L   AST 21  5 - 34 U/L   ALT 12  0 - 55 U/L   Total Protein 7.1  6.4 - 8.3 g/dL   Albumin 3.5  3.5 - 5.0 g/dL   Calcium 29.5 (*) 8.4 - 10.4 mg/dL   Narrative:    Performed At:  [facility]  [address]               [place], [place] [address]

## 2012-06-22 ENCOUNTER — Other Ambulatory Visit: Payer: Self-pay | Admitting: Oncology

## 2012-06-22 DIAGNOSIS — Z9012 Acquired absence of left breast and nipple: Secondary | ICD-10-CM

## 2012-06-22 DIAGNOSIS — Z853 Personal history of malignant neoplasm of breast: Secondary | ICD-10-CM

## 2012-06-22 DIAGNOSIS — Z1231 Encounter for screening mammogram for malignant neoplasm of breast: Secondary | ICD-10-CM

## 2012-06-23 ENCOUNTER — Telehealth: Payer: Self-pay

## 2012-06-23 ENCOUNTER — Telehealth: Payer: Self-pay | Admitting: Family

## 2012-06-23 ENCOUNTER — Telehealth: Payer: Self-pay | Admitting: Oncology

## 2012-06-23 NOTE — Telephone Encounter (Signed)
Letter mail to Dr. Joette Catching from Dr. Darnelle Catalan

## 2012-06-23 NOTE — Telephone Encounter (Signed)
Spoke to [employer], Print production planner at Dr. Darrell Jewel office about patient's elevated calcium.  Erica Conner confirmed receipt of recent progress note, laboratory values and the patient's [facility] graduation letter we sent.  She will notify Dr. Lysbeth Galas of pt's. 10.5 calcium level.

## 2012-06-23 NOTE — Telephone Encounter (Signed)
Received return call from pt from Norina Buzzard, NP.  Informed pt of Jackie's note- lab results are fine except elevated calcium of 10.5, and PCP office was made aware.  Pt verbalizes understanding.

## 2012-06-23 NOTE — Telephone Encounter (Signed)
  Left message for Erica Conner to call me back.  Wanted to review laboratory results with her.  All laboratory results were fine with exception of elevated calcium at 10.5 (normal is 8.4-10.5).  Her PCP's office is aware of elevated calcium level.

## 2012-07-26 ENCOUNTER — Ambulatory Visit
Admission: RE | Admit: 2012-07-26 | Discharge: 2012-07-26 | Disposition: A | Discharge status: Home / Self Care | Payer: Medicare Other | Source: Ambulatory Visit | Attending: Oncology | Admitting: Oncology

## 2012-07-26 DIAGNOSIS — Z9012 Acquired absence of left breast and nipple: Secondary | ICD-10-CM

## 2012-07-26 DIAGNOSIS — Z1231 Encounter for screening mammogram for malignant neoplasm of breast: Secondary | ICD-10-CM

## 2012-07-26 DIAGNOSIS — Z853 Personal history of malignant neoplasm of breast: Secondary | ICD-10-CM

## 2013-06-21 DIAGNOSIS — I2510 Atherosclerotic heart disease of native coronary artery without angina pectoris: Secondary | ICD-10-CM | POA: Insufficient documentation

## 2013-06-21 DIAGNOSIS — E785 Hyperlipidemia, unspecified: Secondary | ICD-10-CM | POA: Insufficient documentation

## 2013-06-28 ENCOUNTER — Other Ambulatory Visit: Payer: Self-pay

## 2013-06-28 DIAGNOSIS — Z9012 Acquired absence of left breast and nipple: Secondary | ICD-10-CM

## 2013-06-28 DIAGNOSIS — Z1231 Encounter for screening mammogram for malignant neoplasm of breast: Secondary | ICD-10-CM

## 2013-08-08 ENCOUNTER — Ambulatory Visit
Admission: RE | Admit: 2013-08-08 | Discharge: 2013-08-08 | Disposition: A | Discharge status: Home / Self Care | Payer: Medicare Other | Source: Ambulatory Visit

## 2013-08-08 DIAGNOSIS — Z9012 Acquired absence of left breast and nipple: Secondary | ICD-10-CM

## 2013-08-08 DIAGNOSIS — Z1231 Encounter for screening mammogram for malignant neoplasm of breast: Secondary | ICD-10-CM

## 2013-08-09 ENCOUNTER — Other Ambulatory Visit: Payer: Self-pay | Admitting: Family Medicine

## 2013-08-09 DIAGNOSIS — R928 Other abnormal and inconclusive findings on diagnostic imaging of breast: Secondary | ICD-10-CM

## 2013-08-16 ENCOUNTER — Ambulatory Visit
Admission: RE | Admit: 2013-08-16 | Discharge: 2013-08-16 | Disposition: A | Discharge status: Home / Self Care | Payer: Medicare Other | Source: Ambulatory Visit | Attending: Family Medicine | Admitting: Family Medicine

## 2013-08-16 DIAGNOSIS — R928 Other abnormal and inconclusive findings on diagnostic imaging of breast: Secondary | ICD-10-CM

## 2014-06-19 ENCOUNTER — Other Ambulatory Visit: Payer: Self-pay

## 2014-06-19 DIAGNOSIS — Z1231 Encounter for screening mammogram for malignant neoplasm of breast: Secondary | ICD-10-CM

## 2014-08-11 ENCOUNTER — Ambulatory Visit: Payer: Self-pay

## 2014-08-15 ENCOUNTER — Ambulatory Visit
Admission: RE | Admit: 2014-08-15 | Discharge: 2014-08-15 | Disposition: A | Discharge status: Home / Self Care | Payer: Medicare Other | Source: Ambulatory Visit

## 2014-08-15 DIAGNOSIS — Z1231 Encounter for screening mammogram for malignant neoplasm of breast: Secondary | ICD-10-CM

## 2015-05-15 ENCOUNTER — Other Ambulatory Visit: Payer: Self-pay

## 2015-05-15 DIAGNOSIS — Z1231 Encounter for screening mammogram for malignant neoplasm of breast: Secondary | ICD-10-CM

## 2015-08-17 ENCOUNTER — Ambulatory Visit
Admission: RE | Admit: 2015-08-17 | Discharge: 2015-08-17 | Disposition: A | Discharge status: Home / Self Care | Payer: Medicare Other | Source: Ambulatory Visit

## 2015-08-17 DIAGNOSIS — Z1231 Encounter for screening mammogram for malignant neoplasm of breast: Secondary | ICD-10-CM

## 2016-05-08 ENCOUNTER — Other Ambulatory Visit: Payer: Self-pay | Admitting: Family Medicine

## 2016-05-08 DIAGNOSIS — Z1231 Encounter for screening mammogram for malignant neoplasm of breast: Secondary | ICD-10-CM

## 2016-08-19 ENCOUNTER — Ambulatory Visit
Admission: RE | Admit: 2016-08-19 | Discharge: 2016-08-19 | Disposition: A | Discharge status: Home / Self Care | Payer: Medicare Other | Source: Ambulatory Visit | Attending: Family Medicine | Admitting: Family Medicine

## 2016-08-19 DIAGNOSIS — Z1231 Encounter for screening mammogram for malignant neoplasm of breast: Secondary | ICD-10-CM

## 2017-03-18 ENCOUNTER — Other Ambulatory Visit: Payer: Self-pay | Admitting: Family Medicine

## 2017-03-18 DIAGNOSIS — Z1231 Encounter for screening mammogram for malignant neoplasm of breast: Secondary | ICD-10-CM

## 2017-08-20 ENCOUNTER — Ambulatory Visit
Admission: RE | Admit: 2017-08-20 | Discharge: 2017-08-20 | Disposition: A | Discharge status: Home / Self Care | Payer: Medicare Other | Source: Ambulatory Visit | Attending: Family Medicine | Admitting: Family Medicine

## 2017-08-20 DIAGNOSIS — Z1231 Encounter for screening mammogram for malignant neoplasm of breast: Secondary | ICD-10-CM

## 2018-06-28 ENCOUNTER — Ambulatory Visit: Payer: Self-pay | Admitting: Family Medicine

## 2018-06-29 ENCOUNTER — Other Ambulatory Visit: Payer: Self-pay

## 2018-06-30 ENCOUNTER — Ambulatory Visit (INDEPENDENT_AMBULATORY_CARE_PROVIDER_SITE_OTHER): Payer: Medicare Other | Admitting: Family Medicine

## 2018-06-30 ENCOUNTER — Encounter: Payer: Self-pay | Admitting: Family Medicine

## 2018-06-30 ENCOUNTER — Ambulatory Visit: Payer: Self-pay | Admitting: Family Medicine

## 2018-06-30 DIAGNOSIS — I499 Cardiac arrhythmia, unspecified: Secondary | ICD-10-CM | POA: Diagnosis not present

## 2018-06-30 DIAGNOSIS — I10 Essential (primary) hypertension: Secondary | ICD-10-CM | POA: Diagnosis not present

## 2018-06-30 DIAGNOSIS — E039 Hypothyroidism, unspecified: Secondary | ICD-10-CM

## 2018-06-30 DIAGNOSIS — E782 Mixed hyperlipidemia: Secondary | ICD-10-CM

## 2018-06-30 LAB — LIPID PANEL

## 2018-06-30 MED ORDER — ATORVASTATIN CALCIUM 40 MG PO TABS: 40.0000 mg | ORAL_TABLET | Freq: Every day | ORAL | 3 refills | Status: DC

## 2018-06-30 MED ORDER — VERAPAMIL HCL ER 240 MG PO TBCR: 240.0000 mg | EXTENDED_RELEASE_TABLET | Freq: Every day | ORAL | 3 refills | Status: DC

## 2018-06-30 MED ORDER — LEVOTHYROXINE SODIUM 88 MCG PO TABS: 88.0000 ug | ORAL_TABLET | Freq: Every day | ORAL | 3 refills | Status: DC

## 2018-06-30 MED ORDER — DIGOXIN 250 MCG PO TABS: 250.0000 ug | ORAL_TABLET | Freq: Every day | ORAL | 3 refills | Status: DC

## 2018-06-30 NOTE — Progress Notes (Signed)
BP (!) 149/76   Pulse 76   Temp (!) 97 F (36.1 C) (Oral)   Ht _0  (1.651 m)   Wt 116 lb 6.4 oz (52.8 kg)   BMI 19.37 kg/m    Subjective:    Patient ID: Erica Conner, female    DOB: 06-28-1926, 85 y.o.   MRN: [id number]  HPI: Erica Conner is a 84 y.o. female presenting on 06/30/2018 for New Patient (Initial Visit) (Dr. Edrick Oh), Establish Care, and Nevus (Would like moles on her face looked at.)   HPI Hypothyroidism recheck Patient is coming in for thyroid recheck today as well. They deny any issues with hair changes or heat or cold problems or diarrhea or constipation. They deny any chest pain or palpitations. They are currently on levothyroxine 34mcrograms   Hypertension Patient is currently on digoxin and verapamil, and their blood pressure today is 149/76. Patient denies any lightheadedness or dizziness. Patient denies headaches, blurred vision, chest pains, shortness of breath, or weakness. Denies any side effects from medication and is content with current medication.   Hyperlipidemia Patient is coming in for recheck of his hyperlipidemia. The patient is currently taking Lipitor. They deny any issues with myalgias or history of liver damage from it. They deny any focal numbness or weakness or chest pain.   Patient has a history of being diagnosed with an irregular heartbeat although she does not know what specific diagnosis.  She has been placed on digoxin for this and at one time they tried to take off the digoxin and she went back into an irregular heartbeat.  She has been on the digoxin for 10 years and has not had any issues as long as she stays on it.  Patient has a history of breast cancer in the left breast and is status post mastectomy about left breast and this was over 10 years ago and she has been cleared since that time.  Patient had a colonoscopy 8 or 9 years ago that cleared her and she has not needed one since.  Relevant past medical, surgical, family  and social history reviewed and updated as indicated. Interim medical history since our last visit reviewed. Allergies and medications reviewed and updated.  Review of Systems  Constitutional: Negative for chills and fever.  Eyes: Negative for redness and visual disturbance.  Respiratory: Negative for chest tightness and shortness of breath.   Cardiovascular: Negative for chest pain, palpitations and leg swelling.  Musculoskeletal: Negative for back pain and gait problem.  Skin: Negative for rash.  Neurological: Negative for light-headedness and headaches.  Psychiatric/Behavioral: Negative for agitation and behavioral problems.  All other systems reviewed and are negative.   Per HPI unless specifically indicated above  Social History   Socioeconomic History  . Marital status: Widowed    Spouse name: Not on file  . Number of children: Not on file  . Years of education: Not on file  . Highest education level: Not on file  Occupational History  . Not on file  Social Needs  . Financial resource strain: Not on file  . Food insecurity    Worry: Not on file    Inability: Not on file  . Transportation needs    Medical: Not on file    Non-medical: Not on file  Tobacco Use  . Smoking status: Never Smoker  . Smokeless tobacco: Never Used  Substance and Sexual Activity  . Alcohol use: No  . Drug use: No  . Sexual activity:  Never    Birth control/protection: Post-menopausal, Surgical  Lifestyle  . Physical activity    Days per week: Not on file    Minutes per session: Not on file  . Stress: Not on file  Relationships  . Social Herbalist on phone: Not on file    Gets together: Not on file    Attends religious service: Not on file    Active member of club or organization: Not on file    Attends meetings of clubs or organizations: Not on file    Relationship status: Not on file  . Intimate partner violence    Fear of current or ex partner: Not on file     Emotionally abused: Not on file    Physically abused: Not on file    Forced sexual activity: Not on file  Other Topics Concern  . Not on file  Social History Narrative  . Not on file    Past Surgical History:  Procedure Laterality Date  . ABDOMINAL HYSTERECTOMY    . APPENDECTOMY    . BREAST CYST EXCISION  1987  . CATARACT EXTRACTION Bilateral 2011  . MASTECTOMY Left 06/2008  . TONSILLECTOMY      Family History  Problem Relation Age of Onset  . Hypertension Mother   . Heart disease Mother   . Heart disease Father   . Heart attack Father   . Cancer Sister        Pancreatic and liver cancer  . Cancer Brother        Colon cancer  . Cancer Other        Breast cancer    Allergies as of 06/30/2018      Reactions   Penicillins Hives   Sulfa Antibiotics Hives      Medication List       Accurate as of June 30, 2018  9:01 AM. If you have any questions, ask your nurse or doctor.        STOP taking these medications   fenofibrate 160 MG tablet Stopped by: Fransisca Kaufmann Rolena Knutson, MD     TAKE these medications   aspirin 81 MG tablet Take 81 mg by mouth daily.   atorvastatin 40 MG tablet Commonly known as: LIPITOR Take 40 mg by mouth daily.   calcium carbonate 200 MG capsule Take 250 mg by mouth daily.   ciprofloxacin 0.3 % ophthalmic solution Commonly known as: CILOXAN Place 1 drop into both eyes 3 (three) times daily.   digoxin 0.25 MG tablet Commonly known as: LANOXIN Take 250 mcg by mouth daily.   fish oil-omega-3 fatty acids 1000 MG capsule Take 2 g by mouth 2 (two) times daily.   levothyroxine 88 MCG tablet Commonly known as: SYNTHROID Take 88 mcg by mouth daily before breakfast.   multivitamin tablet Take 1 tablet by mouth daily.   verapamil 240 MG CR tablet Commonly known as: CALAN-SR Take 240 mg by mouth at bedtime.   Vitamin D 50 MCG (2000 UT) tablet Take 2,000 Units by mouth daily.          Objective:    BP (!) 149/76   Pulse 76    Temp (!) 97 F (36.1 C) (Oral)   Ht _0  (1.651 m)   Wt 116 lb 6.4 oz (52.8 kg)   BMI 19.37 kg/m   Wt Readings from Last 3 Encounters:  06/30/18 116 lb 6.4 oz (52.8 kg)  06/21/12 118 lb (53.5 kg)  12/09/11 118 lb  12.8 oz (53.9 kg)    Physical Exam Vitals signs and nursing note reviewed.  Constitutional:      General: She is not in acute distress.    Appearance: She is well-developed. She is not diaphoretic.  Eyes:     Conjunctiva/sclera: Conjunctivae normal.  Cardiovascular:     Rate and Rhythm: Normal rate and regular rhythm.     Heart sounds: Normal heart sounds. No murmur.  Pulmonary:     Effort: Pulmonary effort is normal. No respiratory distress.     Breath sounds: Normal breath sounds. No wheezing.  Musculoskeletal: Normal range of motion.        General: No tenderness.  Neurological:     Mental Status: She is alert and oriented to person, place, and time.     Coordination: Coordination normal.  Psychiatric:        Behavior: Behavior normal.         Assessment & Plan:   Problem List Items Addressed This Visit      Cardiovascular and Mediastinum   Hypertension   Relevant Medications   verapamil (CALAN-SR) 240 MG CR tablet   digoxin (LANOXIN) 0.25 MG tablet   atorvastatin (LIPITOR) 40 MG tablet   Other Relevant Orders   CMP14+EGFR (Completed)     Endocrine   Hypothyroidism   Relevant Medications   levothyroxine (SYNTHROID) 88 MCG tablet   Other Relevant Orders   TSH (Completed)     Other   Hyperlipemia   Relevant Medications   verapamil (CALAN-SR) 240 MG CR tablet   digoxin (LANOXIN) 0.25 MG tablet   atorvastatin (LIPITOR) 40 MG tablet   Other Relevant Orders   Lipid panel (Completed)   Irregular heart beat   Relevant Medications   digoxin (LANOXIN) 0.25 MG tablet   Other Relevant Orders   CBC with Differential/Platelet (Completed)    Continue verapamil and digoxin and atorvastatin and levothyroxine, will check blood work today.  Patient  has a couple of atypical spots on her face that recommended that she go to a dermatologist for.  Follow up plan: Return in about 6 months (around 12/30/2018), or if symptoms worsen or fail to improve.  Caryl Pina, MD [facility] 06/30/2018, 9:01 AM

## 2018-07-01 LAB — CMP14+EGFR
ALT: 12 IU/L (ref 0–32)
AST: 21 IU/L (ref 0–40)
Albumin/Globulin Ratio: 1.8 (ref 1.2–2.2)
Albumin: 4.6 g/dL (ref 3.6–4.6)
Alkaline Phosphatase: 50 IU/L (ref 39–117)
BUN/Creatinine Ratio: 15 (ref 12–28)
BUN: 10 mg/dL (ref 8–27)
Bilirubin Total: 0.6 mg/dL (ref 0.0–1.2)
CO2: 23 mmol/L (ref 20–29)
Calcium: 9.9 mg/dL (ref 8.7–10.3)
Chloride: 98 mmol/L (ref 96–106)
Creatinine, Ser: 0.68 mg/dL (ref 0.57–1.00)
GFR calc Af Amer: 90 mL/min/{1.73_m2} (ref >59)
GFR calc non Af Amer: 78 mL/min/{1.73_m2} (ref >59)
Globulin, Total: 2.5 g/dL (ref 1.5–4.5)
Glucose: 96 mg/dL (ref 65–99)
Potassium: 4.4 mmol/L (ref 3.5–5.2)
Sodium: 135 mmol/L (ref 134–144)
Total Protein: 7.1 g/dL (ref 6.0–8.5)

## 2018-07-01 LAB — LIPID PANEL
Chol/HDL Ratio: 2.8 ratio (ref 0.0–4.4)
Cholesterol, Total: 143 mg/dL (ref 100–199)
HDL: 51 mg/dL (ref >39)
LDL Calculated: 53 mg/dL (ref 0–99)
Triglycerides: 193 mg/dL — ABNORMAL HIGH (ref 0–149)
VLDL Cholesterol Cal: 39 mg/dL (ref 5–40)

## 2018-07-01 LAB — CBC WITH DIFFERENTIAL/PLATELET
Basophils Absolute: 0 10*3/uL (ref 0.0–0.2)
Basos: 0 %
EOS (ABSOLUTE): 0.1 10*3/uL (ref 0.0–0.4)
Eos: 1 %
Hematocrit: 43.7 % (ref 34.0–46.6)
Hemoglobin: 14.9 g/dL (ref 11.1–15.9)
Immature Grans (Abs): 0 10*3/uL (ref 0.0–0.1)
Immature Granulocytes: 0 %
Lymphocytes Absolute: 1.9 10*3/uL (ref 0.7–3.1)
Lymphs: 24 %
MCH: 31.7 pg (ref 26.6–33.0)
MCHC: 34.1 g/dL (ref 31.5–35.7)
MCV: 93 fL (ref 79–97)
Monocytes Absolute: 0.7 10*3/uL (ref 0.1–0.9)
Monocytes: 9 %
Neutrophils Absolute: 5.1 10*3/uL (ref 1.4–7.0)
Neutrophils: 66 %
Platelets: 278 10*3/uL (ref 150–450)
RBC: 4.7 x10E6/uL (ref 3.77–5.28)
RDW: 12.5 % (ref 11.7–15.4)
WBC: 7.8 10*3/uL (ref 3.4–10.8)

## 2018-07-01 LAB — TSH: TSH: 1.41 u[IU]/mL (ref 0.450–4.500)

## 2018-10-08 ENCOUNTER — Telehealth: Payer: Self-pay

## 2018-10-11 ENCOUNTER — Encounter: Payer: Self-pay | Admitting: Family Medicine

## 2018-10-11 ENCOUNTER — Telehealth: Payer: Self-pay | Admitting: Family Medicine

## 2018-10-11 ENCOUNTER — Ambulatory Visit (INDEPENDENT_AMBULATORY_CARE_PROVIDER_SITE_OTHER): Payer: Medicare Other | Admitting: Family Medicine

## 2018-10-11 ENCOUNTER — Ambulatory Visit (HOSPITAL_COMMUNITY)
Admission: RE | Admit: 2018-10-11 | Discharge: 2018-10-11 | Disposition: A | Discharge status: Home / Self Care | Payer: Medicare Other | Source: Ambulatory Visit | Attending: Family Medicine | Admitting: Family Medicine

## 2018-10-11 ENCOUNTER — Ambulatory Visit (INDEPENDENT_AMBULATORY_CARE_PROVIDER_SITE_OTHER): Payer: Medicare Other

## 2018-10-11 ENCOUNTER — Other Ambulatory Visit: Payer: Self-pay

## 2018-10-11 VITALS — BP 144/68 | HR 61 | Temp 97.1°F | Resp 18 | Ht 65.0 in | Wt 114.0 lb

## 2018-10-11 DIAGNOSIS — R911 Solitary pulmonary nodule: Secondary | ICD-10-CM | POA: Diagnosis not present

## 2018-10-11 DIAGNOSIS — K8020 Calculus of gallbladder without cholecystitis without obstruction: Secondary | ICD-10-CM | POA: Diagnosis not present

## 2018-10-11 DIAGNOSIS — J189 Pneumonia, unspecified organism: Secondary | ICD-10-CM | POA: Diagnosis not present

## 2018-10-11 DIAGNOSIS — I700 Atherosclerosis of aorta: Secondary | ICD-10-CM | POA: Insufficient documentation

## 2018-10-11 DIAGNOSIS — R140 Abdominal distension (gaseous): Secondary | ICD-10-CM

## 2018-10-11 DIAGNOSIS — K5900 Constipation, unspecified: Secondary | ICD-10-CM

## 2018-10-11 DIAGNOSIS — K862 Cyst of pancreas: Secondary | ICD-10-CM | POA: Diagnosis not present

## 2018-10-11 DIAGNOSIS — R918 Other nonspecific abnormal finding of lung field: Secondary | ICD-10-CM | POA: Insufficient documentation

## 2018-10-11 LAB — POCT I-STAT CREATININE: Creatinine, Ser: 0.7 mg/dL (ref 0.44–1.00)

## 2018-10-11 MED ORDER — IOHEXOL 300 MG/ML  SOLN
75.0000 mL | Freq: Once | INTRAMUSCULAR | Status: AC | PRN
  Administered 2018-10-11: 12:00:00 75 mL via INTRAVENOUS

## 2018-10-11 MED ORDER — IOHEXOL 300 MG/ML  SOLN
30.0000 mL | Freq: Once | INTRAMUSCULAR | Status: AC | PRN
  Administered 2018-10-11: 13:00:00 30 mL via ORAL

## 2018-10-11 NOTE — Patient Instructions (Signed)
Thank you for coming in to clinic today.  1. Your symptoms are consistent with Constipation, likely cause of your General Abdominal Pain / Cramping. 2. Start with Miralax, prescription was sent to pharmacy. First dose 68g (4 capfuls) in 32oz water over 1 to 2 hours for clean out. Next day start 17g or 1 capful daily, may adjust dose up or down by half a capful every few days. Recommend to take this medicine daily for next 1-2 weeks, you may need to use it longer if needed. - Goal is to have soft regular bowel movement 1-3x daily, if too runny or diarrhea, then reduce dose of the medicine to every other day.  Improve water intake, hydration will help Also recommend increased vegetables, fruits, fiber intake Can try daily Metamucil or Fiber supplement at pharmacy over the counter  Follow-up if symptoms are not improving with bowel movements, or if pain worsens, develop fevers, nausea, vomiting.  Please schedule a follow-up appointment with Michelle Jacy Brocker, FNP, in 1 month to follow-up Constipation  If you have any other questions or concerns, please feel free to call the clinic to contact me. You may also schedule an earlier appointment if necessary.  However, if your symptoms get significantly worse, please go to the Emergency Department to seek immediate medical attention.  

## 2018-10-11 NOTE — Addendum Note (Signed)
Addended by: Baruch Gouty on: 10/11/2018 07:09 PM   Modules accepted: Orders

## 2018-10-11 NOTE — Progress Notes (Signed)
   Subjective:  Patient ID: Erica Conner, female    DOB: 12/14/1926, 85 y.o.   MRN: [id number]  Patient Care Team: Dettinger, Joshua A, MD as PCP - General (Family Medicine)   Chief Complaint:  Bloated   HPI: Erica Conner is a 85 y.o. female presenting on 10/11/2018 for Bloated   Pt and daughter report abdominal distention for 2-4 weeks. States at one point her abdomen was very distended. States that has since subsided. She denies pain with the abdominal distention. States she is having bowel movement. States they are soft and formed. No ribbon or pencil like stools. No diarrhea. No nausea or vomiting. No fever, chills, weakness, or syncope. Does feel tired at times. No other associated symptoms.     Relevant past medical, surgical, family, and social history reviewed and updated as indicated.  Allergies and medications reviewed and updated. Date reviewed: Chart in Epic.   Past Medical History:  Diagnosis Date  . Ankle fracture, right 2000  . Breast cancer (HCC) 04/2007   Left  . Cataracts, bilateral   . Hyperlipidemia   . Hypertension   . Hypothyroid   . SVT (supraventricular tachycardia) (HCC)   . Syncope     Past Surgical History:  Procedure Laterality Date  . ABDOMINAL HYSTERECTOMY    . APPENDECTOMY    . BREAST CYST EXCISION  1987   again left breast cancer in 2009  . CATARACT EXTRACTION Bilateral 2011  . MASTECTOMY Left 06/2008  . TONSILLECTOMY      Social History   Socioeconomic History  . Marital status: Widowed    Spouse name: Not on file  . Number of children: Not on file  . Years of education: Not on file  . Highest education level: Not on file  Occupational History  . Not on file  Social Needs  . Financial resource strain: Not on file  . Food insecurity    Worry: Not on file    Inability: Not on file  . Transportation needs    Medical: Not on file    Non-medical: Not on file  Tobacco Use  . Smoking status: Never Smoker  . Smokeless  tobacco: Never Used  Substance and Sexual Activity  . Alcohol use: No  . Drug use: No  . Sexual activity: Not Currently    Birth control/protection: Post-menopausal, Surgical  Lifestyle  . Physical activity    Days per week: Not on file    Minutes per session: Not on file  . Stress: Not on file  Relationships  . Social connections    Talks on phone: Not on file    Gets together: Not on file    Attends religious service: Not on file    Active member of club or organization: Not on file    Attends meetings of clubs or organizations: Not on file    Relationship status: Not on file  . Intimate partner violence    Fear of current or ex partner: Not on file    Emotionally abused: Not on file    Physically abused: Not on file    Forced sexual activity: Not on file  Other Topics Concern  . Not on file  Social History Narrative   Husband passed in 2005    Outpatient Encounter Medications as of 10/11/2018  Medication Sig  . aspirin 81 MG tablet Take 81 mg by mouth daily.  . atorvastatin (LIPITOR) 40 MG tablet Take 1 tablet (40 mg total) by mouth   daily.  . calcium carbonate 200 MG capsule Take 250 mg by mouth daily.  . Carboxymethylcellulose Sodium (EYE DROPS) 0.5 % SOLN Apply 1 drop to eye daily.  . Cholecalciferol (VITAMIN D) 2000 UNITS tablet Take 2,000 Units by mouth daily.  . digoxin (LANOXIN) 0.25 MG tablet Take 1 tablet (250 mcg total) by mouth daily.  . fish oil-omega-3 fatty acids 1000 MG capsule Take 2 g by mouth 2 (two) times daily.  Marland Kitchen levothyroxine (SYNTHROID) 88 MCG tablet Take 1 tablet (88 mcg total) by mouth daily before breakfast.  . Multiple Vitamin (MULTIVITAMIN) tablet Take 1 tablet by mouth daily.  . verapamil (CALAN-SR) 240 MG CR tablet Take 1 tablet (240 mg total) by mouth at bedtime.   No facility-administered encounter medications on file as of 10/11/2018.     Allergies  Allergen Reactions  . Penicillins Hives  . Sulfa Antibiotics Hives    Review of  Systems  Constitutional: Positive for fatigue. Negative for activity change, appetite change, chills, diaphoresis, fever and unexpected weight change.  HENT: Negative.   Eyes: Negative.   Respiratory: Negative for cough, chest tightness and shortness of breath.   Cardiovascular: Negative for chest pain, palpitations and leg swelling.  Gastrointestinal: Positive for abdominal distention. Negative for abdominal pain, anal bleeding, blood in stool, constipation, diarrhea, nausea, rectal pain and vomiting.  Endocrine: Negative.   Genitourinary: Negative for decreased urine volume, difficulty urinating, dysuria, flank pain, frequency, hematuria and urgency.  Musculoskeletal: Negative for arthralgias, back pain, gait problem, joint swelling and myalgias.  Skin: Negative.   Allergic/Immunologic: Negative.   Neurological: Negative for dizziness, tremors, seizures, syncope, facial asymmetry, speech difficulty, weakness, light-headedness, numbness and headaches.  Hematological: Negative.   Psychiatric/Behavioral: Positive for confusion (baseline). Negative for hallucinations, sleep disturbance and suicidal ideas.  All other systems reviewed and are negative.       Objective:  BP (!) 144/68   Pulse 61   Temp (!) 97.1 F (36.2 C)   Resp 18   Ht 5' 5" (1.651 m)   Wt 114 lb (51.7 kg)   SpO2 96%   BMI 18.97 kg/m    Wt Readings from Last 3 Encounters:  10/11/18 114 lb (51.7 kg)  06/30/18 116 lb 6.4 oz (52.8 kg)  06/21/12 118 lb (53.5 kg)    Physical Exam Vitals signs and nursing note reviewed.  Constitutional:      General: She is not in acute distress.    Appearance: Normal appearance. She is well-developed and well-groomed. She is not ill-appearing, toxic-appearing or diaphoretic.  HENT:     Head: Normocephalic and atraumatic.     Jaw: There is normal jaw occlusion.     Right Ear: Hearing normal.     Left Ear: Hearing normal.     Nose: Nose normal.     Mouth/Throat:     Lips:  Pink.     Mouth: Mucous membranes are moist.     Pharynx: Oropharynx is clear. Uvula midline.  Eyes:     General: Lids are normal.     Extraocular Movements: Extraocular movements intact.     Conjunctiva/sclera: Conjunctivae normal.     Pupils: Pupils are equal, round, and reactive to light.  Neck:     Musculoskeletal: Normal range of motion and neck supple.     Thyroid: No thyroid mass, thyromegaly or thyroid tenderness.     Vascular: No carotid bruit or JVD.     Trachea: Trachea and phonation normal.  Cardiovascular:  Rate and Rhythm: Normal rate and regular rhythm.     Chest Maule: PMI is not displaced.     Pulses: Normal pulses.     Heart sounds: Normal heart sounds. No murmur. No friction rub. No gallop.   Pulmonary:     Effort: Pulmonary effort is normal. No respiratory distress.     Breath sounds: Normal breath sounds. No wheezing.  Abdominal:     General: Bowel sounds are normal. There is distension. There is no abdominal bruit. There are no signs of injury.     Palpations: Abdomen is soft. There is no shifting dullness, fluid wave, hepatomegaly, splenomegaly, mass or pulsatile mass.     Tenderness: There is no abdominal tenderness. There is no right CVA tenderness, left CVA tenderness, guarding or rebound.     Hernia: No hernia is present. There is no hernia in the umbilical area or ventral area.     Comments: Slight distention to periumbilical area, soft and nontender. No mass or deformity. No hernia when raising head off of exam table.   Musculoskeletal: Normal range of motion.     Right lower leg: No edema.     Left lower leg: No edema.  Lymphadenopathy:     Cervical: No cervical adenopathy.  Skin:    General: Skin is warm and dry.     Capillary Refill: Capillary refill takes less than 2 seconds.     Coloration: Skin is not cyanotic, jaundiced or pale.     Findings: No rash.  Neurological:     General: No focal deficit present.     Mental Status: She is alert.  Mental status is at baseline.     Cranial Nerves: Cranial nerves are intact.     Sensory: Sensation is intact.     Motor: Motor function is intact.     Coordination: Coordination is intact.     Gait: Gait is intact.     Deep Tendon Reflexes: Reflexes are normal and symmetric.  Psychiatric:        Attention and Perception: Attention and perception normal.        Mood and Affect: Mood and affect normal.        Speech: Speech normal.        Behavior: Behavior normal. Behavior is cooperative.        Thought Content: Thought content normal.        Cognition and Memory: Cognition and memory normal.        Judgment: Judgment normal.     Results for orders placed or performed in visit on 06/30/18  CBC with Differential/Platelet  Result Value Ref Range   WBC 7.8 3.4 - 10.8 x10E3/uL   RBC 4.70 3.77 - 5.28 x10E6/uL   Hemoglobin 14.9 11.1 - 15.9 g/dL   Hematocrit 43.7 34.0 - 46.6 %   MCV 93 79 - 97 fL   MCH 31.7 26.6 - 33.0 pg   MCHC 34.1 31.5 - 35.7 g/dL   RDW 12.5 11.7 - 15.4 %   Platelets 278 150 - 450 x10E3/uL   Neutrophils 66 Not Estab. %   Lymphs 24 Not Estab. %   Monocytes 9 Not Estab. %   Eos 1 Not Estab. %   Basos 0 Not Estab. %   Neutrophils Absolute 5.1 1.4 - 7.0 x10E3/uL   Lymphocytes Absolute 1.9 0.7 - 3.1 x10E3/uL   Monocytes Absolute 0.7 0.1 - 0.9 x10E3/uL   EOS (ABSOLUTE) 0.1 0.0 - 0.4 x10E3/uL   Basophils Absolute 0.0   0.0 - 0.2 x10E3/uL   Immature Granulocytes 0 Not Estab. %   Immature Grans (Abs) 0.0 0.0 - 0.1 x10E3/uL  CMP14+EGFR  Result Value Ref Range   Glucose 96 65 - 99 mg/dL   BUN 10 8 - 27 mg/dL   Creatinine, Ser 0.68 0.57 - 1.00 mg/dL   GFR calc non Af Amer 78 >59 mL/min/1.73   GFR calc Af Amer 90 >59 mL/min/1.73   BUN/Creatinine Ratio 15 12 - 28   Sodium 135 134 - 144 mmol/L   Potassium 4.4 3.5 - 5.2 mmol/L   Chloride 98 96 - 106 mmol/L   CO2 23 20 - 29 mmol/L   Calcium 9.9 8.7 - 10.3 mg/dL   Total Protein 7.1 6.0 - 8.5 g/dL   Albumin 4.6 3.6 -  4.6 g/dL   Globulin, Total 2.5 1.5 - 4.5 g/dL   Albumin/Globulin Ratio 1.8 1.2 - 2.2   Bilirubin Total 0.6 0.0 - 1.2 mg/dL   Alkaline Phosphatase 50 39 - 117 IU/L   AST 21 0 - 40 IU/L   ALT 12 0 - 32 IU/L  TSH  Result Value Ref Range   TSH 1.410 0.450 - 4.500 uIU/mL  Lipid panel  Result Value Ref Range   Cholesterol, Total 143 100 - 199 mg/dL   Triglycerides 193 (H) 0 - 149 mg/dL   HDL 51 >39 mg/dL   VLDL Cholesterol Cal 39 5 - 40 mg/dL   LDL Calculated 53 0 - 99 mg/dL   Chol/HDL Ratio 2.8 0.0 - 4.4 ratio      Pertinent labs & imaging results that were available during my care of the patient were reviewed by me and considered in my medical decision making.  Assessment & Plan:  Erica Conner was seen today for bloated.  Diagnoses and all orders for this visit:  Abdominal distension Gaseous bowel distention with some stool burden noted on KUB. Radiologist would like further imaging. Pt and family aware and will go to [facility] for imaging. Treatment depending on CT results.  -     DG Abd 1 View; Future -     CT Abdomen Pelvis W Contrast; Future -     POCT I-STAT Creatinine  Constipation in female Discussed care at home. Increase water and fiber intake. Increase activity. Pt aware to report any new or worsening symptoms. Pt aware of symptoms that require emergent evaluation and treatment.     Continue all other maintenance medications.  Follow up plan: Return in about 4 weeks (around 11/08/2018), or if symptoms worsen or fail to improve, for constipation.  Continue healthy lifestyle choices, including diet (rich in fruits, vegetables, and lean proteins, and low in salt and simple carbohydrates) and exercise (at least 30 minutes of moderate physical activity daily).  Educational handout given for constipation   The above assessment and management plan was discussed with the patient. The patient verbalized understanding of and has agreed to the management plan. Patient is aware  to call the clinic if they develop any new symptoms or if symptoms persist or worsen. Patient is aware when to return to the clinic for a follow-up visit. Patient educated on when it is appropriate to go to the emergency department.   Monia Pouch, FNP-C [facility] [phone]

## 2018-10-11 NOTE — Telephone Encounter (Signed)
Pt called

## 2018-10-12 ENCOUNTER — Telehealth: Payer: Self-pay | Admitting: Family Medicine

## 2018-10-12 NOTE — Telephone Encounter (Signed)
Aware referral is being worked on and we will call as soon as it is scheduled.

## 2018-10-15 ENCOUNTER — Other Ambulatory Visit: Payer: Self-pay

## 2018-10-15 ENCOUNTER — Telehealth: Payer: Self-pay | Admitting: Family Medicine

## 2018-10-15 ENCOUNTER — Ambulatory Visit (HOSPITAL_COMMUNITY)
Admission: RE | Admit: 2018-10-15 | Discharge: 2018-10-15 | Disposition: A | Discharge status: Home / Self Care | Payer: Medicare Other | Source: Ambulatory Visit | Attending: Family Medicine | Admitting: Family Medicine

## 2018-10-15 DIAGNOSIS — R911 Solitary pulmonary nodule: Secondary | ICD-10-CM | POA: Insufficient documentation

## 2018-10-15 MED ORDER — IOHEXOL 300 MG/ML  SOLN
75.0000 mL | Freq: Once | INTRAMUSCULAR | Status: AC | PRN
  Administered 2018-10-15: 75 mL via INTRAVENOUS

## 2018-10-15 MED ORDER — DOXYCYCLINE HYCLATE 100 MG PO TABS: 100.0000 mg | ORAL_TABLET | Freq: Two times a day (BID) | ORAL | 0 refills | Status: AC

## 2018-10-15 NOTE — Telephone Encounter (Signed)
Pt daughter called and aware that reading of CT chest was not "final" yet, - still says "exam ended"   Aware Rakes will get notification/ results when results.

## 2018-10-15 NOTE — Telephone Encounter (Signed)
The reading is there, it was stat, so I have talked with the radiologist and I called the pt and family about the results. I will copy the results here:  IMPRESSION: 1. Moderate stool and gas throughout the colon with large amount of gas in the sigmoid colon. Equivocal mild circumferential Harbeson thickening of the distal sigmoid colon/rectum which may be due to poor distension, but consider follow-up. No evidence of bowel obstruction or inflammatory changes. 2. Nonspecific RIGHT middle lobe opacity/consolidation and ill-defined RIGHT LOWER lobe nodular opacities. Differential includes infection and malignancy. Complete chest CT with contrast is recommended for further evaluation. 3. Cholelithiasis without CT evidence of acute cholecystitis 4. 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 No evidence of adjacent hemorrhage or hematoma. Recommend followup by ultrasound in 5 years. This recommendation follows ACR consensus guidelines: White Paper of the ACR Incidental Findings Committee II on Vascular Findings. J Am Coll Radiol 2013; 10:789-794. 5. 1.6 cm pancreatic cyst. Recommend follow up pre and post contrast MRI/MRCP or pancreatic protocol CT in 2 years. This recommendation follows ACR consensus guidelines: Management of Incidental Pancreatic Cysts: A White Paper of the ACR Incidental Findings Committee. J Am Coll Radiol [id number]. 6.  Aortic Atherosclerosis (ICD10-I70.0).   Electronically Signed   By: Margarette Canada M.D.   On: 10/11/2018 13:28

## 2018-10-15 NOTE — Addendum Note (Signed)
Addended by: Baruch Gouty on: 10/15/2018 07:51 PM   Modules accepted: Orders

## 2018-11-19 ENCOUNTER — Other Ambulatory Visit: Payer: Self-pay | Admitting: *Deleted

## 2018-12-13 ENCOUNTER — Encounter: Payer: Self-pay | Admitting: Family Medicine

## 2018-12-13 ENCOUNTER — Other Ambulatory Visit: Payer: Self-pay

## 2018-12-13 ENCOUNTER — Ambulatory Visit (INDEPENDENT_AMBULATORY_CARE_PROVIDER_SITE_OTHER): Payer: Medicare Other | Admitting: Family Medicine

## 2018-12-13 VITALS — BP 165/79 | HR 70 | Temp 97.5°F | Ht 65.0 in | Wt 115.6 lb

## 2018-12-13 DIAGNOSIS — K5900 Constipation, unspecified: Secondary | ICD-10-CM

## 2018-12-13 DIAGNOSIS — Z23 Encounter for immunization: Secondary | ICD-10-CM | POA: Diagnosis not present

## 2018-12-13 DIAGNOSIS — E782 Mixed hyperlipidemia: Secondary | ICD-10-CM

## 2018-12-13 DIAGNOSIS — I10 Essential (primary) hypertension: Secondary | ICD-10-CM

## 2018-12-13 DIAGNOSIS — E039 Hypothyroidism, unspecified: Secondary | ICD-10-CM

## 2018-12-13 DIAGNOSIS — L570 Actinic keratosis: Secondary | ICD-10-CM | POA: Diagnosis not present

## 2018-12-13 NOTE — Addendum Note (Signed)
Addended by: Thana Ates on: 12/13/2018 08:55 AM   Modules accepted: Orders

## 2018-12-13 NOTE — Progress Notes (Signed)
BP (!) 165/79   Pulse 70   Temp (!) 97.5 F (36.4 C) (Temporal)   Ht '5\' 5"'  (1.651 m)   Wt 115 lb 9.6 oz (52.4 kg)   SpO2 97%   BMI 19.24 kg/m    Subjective:   Patient ID: Erica Conner, female    DOB: [birth year], 85 y.o.   MRN: [id number]  HPI: Erica Conner is a 85 y.o. female presenting on 12/13/2018 for Hyperlipidemia (6 month followup- Patient states that she is still having abd swelling ) and mole removal (face)   HPI Hypertension and irregular heartbeat with history of CAD Patient is currently on verapamil and digoxin, and their blood pressure today is 165/79, she has a home blood pressure cuff but has not been taking it, will recommend for her to take it over the next couple weeks and call us in with some numbers. Patient denies any lightheadedness or dizziness. Patient denies headaches, blurred vision, chest pains, shortness of breath, or weakness. Denies any side effects from medication and is content with current medication.   Hyperlipidemia Patient is coming in for recheck of his hyperlipidemia. The patient is currently taking fish oil and atorvastatin. They deny any issues with myalgias or history of liver damage from it. They deny any focal numbness or weakness or chest pain.   Hypothyroidism recheck Patient is coming in for thyroid recheck today as well. They deny any issues with hair changes or heat or cold problems or diarrhea or constipation. They deny any chest pain or palpitations. They are currently on levothyroxine 64mcrograms   Patient has a nonhealing lesion on her left face which looks about the same as it did 6 months ago and still looks the same over the past couple months and is just scabbed over and slightly pink.  She denies any irritation  Abdominal fullness and distention Patient complains of abdominal fullness and distention that is been bothering her over the past couple months.  She did have an x-ray and a CT scan which showed that she was  distended from extensive amounts of stool and constipation.  She has been using the occasional MiraLAX and Metamucil to help move things along and she is having regular bowel movements but she still has some distention.  She denies any abdominal pain.  Relevant past medical, surgical, family and social history reviewed and updated as indicated. Interim medical history since our last visit reviewed. Allergies and medications reviewed and updated.  Review of Systems  Constitutional: Negative for chills and fever.  Eyes: Negative for visual disturbance.  Respiratory: Negative for chest tightness and shortness of breath.   Cardiovascular: Negative for chest pain and leg swelling.  Gastrointestinal: Positive for abdominal distention and constipation. Negative for abdominal pain, diarrhea, nausea and vomiting.  Musculoskeletal: Negative for back pain and gait problem.  Skin: Negative for rash.  Neurological: Negative for dizziness, light-headedness and headaches.  Psychiatric/Behavioral: Negative for agitation and behavioral problems.  All other systems reviewed and are negative.   Per HPI unless specifically indicated above   Allergies as of 12/13/2018      Reactions   Penicillins Hives   Sulfa Antibiotics Hives      Medication List       Accurate as of December 13, 2018  8:40 AM. If you have any questions, ask your nurse or doctor.        aspirin 81 MG tablet Take 81 mg by mouth daily.   atorvastatin 40 MG tablet Commonly  known as: LIPITOR Take 1 tablet (40 mg total) by mouth daily.   calcium carbonate 200 MG capsule Take 250 mg by mouth daily.   digoxin 0.25 MG tablet Commonly known as: LANOXIN Take 1 tablet (250 mcg total) by mouth daily.   Eye Drops 0.5 % Soln Apply 1 drop to eye daily.   fish oil-omega-3 fatty acids 1000 MG capsule Take 2 g by mouth 2 (two) times daily.   levothyroxine 88 MCG tablet Commonly known as: SYNTHROID Take 1 tablet (88 mcg total) by  mouth daily before breakfast.   multivitamin tablet Take 1 tablet by mouth daily.   verapamil 240 MG CR tablet Commonly known as: CALAN-SR Take 1 tablet (240 mg total) by mouth at bedtime.   Vitamin D 50 MCG (2000 UT) tablet Take 2,000 Units by mouth daily.        Objective:   BP (!) 165/79   Pulse 70   Temp (!) 97.5 F (36.4 C) (Temporal)   Ht '5\' 5"'  (1.651 m)   Wt 115 lb 9.6 oz (52.4 kg)   SpO2 97%   BMI 19.24 kg/m   Wt Readings from Last 3 Encounters:  12/13/18 115 lb 9.6 oz (52.4 kg)  10/11/18 114 lb (51.7 kg)  06/30/18 116 lb 6.4 oz (52.8 kg)    Physical Exam Vitals signs and nursing note reviewed.  Constitutional:      General: She is not in acute distress.    Appearance: She is well-developed. She is not diaphoretic.  Eyes:     Conjunctiva/sclera: Conjunctivae normal.  Cardiovascular:     Rate and Rhythm: Normal rate and regular rhythm.     Heart sounds: Normal heart sounds. No murmur.  Pulmonary:     Effort: Pulmonary effort is normal. No respiratory distress.     Breath sounds: Normal breath sounds. No wheezing.  Musculoskeletal: Normal range of motion.        General: No tenderness.  Skin:    General: Skin is warm and dry.     Findings: No rash.     Comments: Left face lesion that slight pink discoloration with scabbed over, about the same as 6 months ago  Neurological:     Mental Status: She is alert and oriented to person, place, and time.     Coordination: Coordination normal.  Psychiatric:        Behavior: Behavior normal.      Actinic keratosis cryotherapy: Used liquid nitrogen in 3-10-second bursts on wart.  Patient tolerated well, gave instructions on what to watch for blistering and keeping the wound site clean.  Assessment & Plan:   Problem List Items Addressed This Visit      Cardiovascular and Mediastinum   Hypertension - Primary   Relevant Orders   CBC with Differential/Platelet   CMP14+EGFR     Endocrine   Hypothyroidism    Relevant Orders   CBC with Differential/Platelet   TSH     Other   Hyperlipidemia   Relevant Orders   Lipid panel    Other Visit Diagnoses    Constipation in female       Actinic keratosis of left cheek          Patient will monitor her blood pressures closely and let us know over the next couple months how it is been running, will see back in 6 months.  We will do blood work today. No change in medication currently Follow up plan: Return in about 6 months (around  06/13/2019), or if symptoms worsen or fail to improve, for Hyperlipidemia and thyroid.  Counseling provided for all of the vaccine components Orders Placed This Encounter  Procedures  . CBC with Differential/Platelet  . CMP14+EGFR  . Lipid panel  . TSH    Caryl Pina, MD [facility] 12/13/2018, 8:40 AM

## 2018-12-14 LAB — LIPID PANEL
Chol/HDL Ratio: 2.2 ratio (ref 0.0–4.4)
Cholesterol, Total: 122 mg/dL (ref 100–199)
HDL: 55 mg/dL (ref >39)
LDL Chol Calc (NIH): 42 mg/dL (ref 0–99)
Triglycerides: 150 mg/dL — ABNORMAL HIGH (ref 0–149)
VLDL Cholesterol Cal: 25 mg/dL (ref 5–40)

## 2018-12-14 LAB — CBC WITH DIFFERENTIAL/PLATELET
Basophils Absolute: 0 10*3/uL (ref 0.0–0.2)
Basos: 1 %
EOS (ABSOLUTE): 0.1 10*3/uL (ref 0.0–0.4)
Eos: 1 %
Hematocrit: 46 % (ref 34.0–46.6)
Hemoglobin: 15.8 g/dL (ref 11.1–15.9)
Immature Grans (Abs): 0 10*3/uL (ref 0.0–0.1)
Immature Granulocytes: 0 %
Lymphocytes Absolute: 1.7 10*3/uL (ref 0.7–3.1)
Lymphs: 23 %
MCH: 31.5 pg (ref 26.6–33.0)
MCHC: 34.3 g/dL (ref 31.5–35.7)
MCV: 92 fL (ref 79–97)
Monocytes Absolute: 0.7 10*3/uL (ref 0.1–0.9)
Monocytes: 9 %
Neutrophils Absolute: 5 10*3/uL (ref 1.4–7.0)
Neutrophils: 66 %
Platelets: 275 10*3/uL (ref 150–450)
RBC: 5.01 x10E6/uL (ref 3.77–5.28)
RDW: 12.8 % (ref 11.7–15.4)
WBC: 7.5 10*3/uL (ref 3.4–10.8)

## 2018-12-14 LAB — CMP14+EGFR
ALT: 17 IU/L (ref 0–32)
AST: 26 IU/L (ref 0–40)
Albumin/Globulin Ratio: 2 (ref 1.2–2.2)
Albumin: 4.6 g/dL (ref 3.5–4.6)
Alkaline Phosphatase: 62 IU/L (ref 39–117)
BUN/Creatinine Ratio: 13 (ref 12–28)
BUN: 9 mg/dL — ABNORMAL LOW (ref 10–36)
Bilirubin Total: 0.6 mg/dL (ref 0.0–1.2)
CO2: 21 mmol/L (ref 20–29)
Calcium: 9.9 mg/dL (ref 8.7–10.3)
Chloride: 99 mmol/L (ref 96–106)
Creatinine, Ser: 0.67 mg/dL (ref 0.57–1.00)
GFR calc Af Amer: 89 mL/min/{1.73_m2} (ref >59)
GFR calc non Af Amer: 78 mL/min/{1.73_m2} (ref >59)
Globulin, Total: 2.3 g/dL (ref 1.5–4.5)
Glucose: 98 mg/dL (ref 65–99)
Potassium: 4.3 mmol/L (ref 3.5–5.2)
Sodium: 137 mmol/L (ref 134–144)
Total Protein: 6.9 g/dL (ref 6.0–8.5)

## 2018-12-14 LAB — TSH: TSH: 1.26 u[IU]/mL (ref 0.450–4.500)

## 2018-12-20 ENCOUNTER — Telehealth: Payer: Self-pay | Admitting: Family Medicine

## 2018-12-20 ENCOUNTER — Other Ambulatory Visit: Payer: Self-pay | Admitting: Family Medicine

## 2018-12-20 MED ORDER — LISINOPRIL 5 MG PO TABS: 5.0000 mg | ORAL_TABLET | Freq: Every day | ORAL | 2 refills | Status: DC

## 2018-12-20 NOTE — Telephone Encounter (Signed)
Please review and advise on new blood pressure readings.

## 2018-12-20 NOTE — Telephone Encounter (Signed)
Please let family know that the blood bressures overall are too high. I sent in a dose of lisinopril to go with the verapamil (calan) that she is already taking.

## 2018-12-20 NOTE — Telephone Encounter (Signed)
Patient aware and verbalized understanding. °

## 2018-12-20 NOTE — Telephone Encounter (Signed)
Patients daughter called stating that pt saw Dr Livia Snellen last wk for an appt and was told to document her BP. Daughter says on 12/14/18 it was 139/72, 12/15/18 was 144/65, 12/16/18 was 164/78, 12/17/18 was 168/84, 12/18/18 was 178/76, and 12/19/18 was 161/65. Daughter would like a call back about BP and if there needs to be an adjustment with patients BP medication. Also wants results from when patient had her blood work.

## 2018-12-21 ENCOUNTER — Telehealth: Payer: Self-pay | Admitting: Family Medicine

## 2018-12-21 NOTE — Telephone Encounter (Signed)
Spoke with pharmacist and they gave lisinopril (ZESTRIL) 5 MG tablet side effects with dry cough and dizziness they are wondering if this is smart for her to be on?

## 2018-12-22 NOTE — Telephone Encounter (Signed)
Daughter aware and verbalizes understanding.  Daughter states she is worried since she is on two bp medications.  Is worried since she lives alone and she does not want her dizzy. States that patient is already a little unstable when standing and wants to make sure this will not make it worse.  Please advise

## 2018-12-22 NOTE — Telephone Encounter (Signed)
Blood pressure pills only cause dizziness if the blood pressure goes too low and hers was in the 160s so I do not think that a low-dose 5 mg lisinopril will cause her to go too low but if she has any symptoms of feeling like she is lightheaded or her blood pressures going too low then she should let us know but I do not think that is going to happen on 5 mg, the standard dose that we start give 1 is 20 mg for most adults says she is going to be taking a fourth of the standard dose.  As far as the dry cough that is something that some people experience but it is a small percentage and if she does experience that then we can always stop it and it will go away.  I do not think the dizziness is going to be a problem for her based on her previous blood pressures.

## 2018-12-22 NOTE — Telephone Encounter (Signed)
Let her know that instability is different than dizziness and lightheadedness but let me know if any lightheadedness occurs, I do understand her concerns and is why I started her at such a low dose, I am thinking to try this just because I want her blood pressure to be more in the 150s or below rather than being in the 160s or 170s, the other option is if they can to check her blood pressure over the next couple weeks every day and send me some numbers.

## 2018-12-22 NOTE — Telephone Encounter (Signed)
Daughter aware and verbalizes understanding- states she will have her mother start the medication.

## 2019-01-04 ENCOUNTER — Telehealth: Payer: Self-pay | Admitting: Family Medicine

## 2019-01-04 ENCOUNTER — Encounter: Payer: Self-pay | Admitting: Family Medicine

## 2019-01-04 NOTE — Telephone Encounter (Signed)
Please advise on lisinopril causing cough.

## 2019-01-04 NOTE — Telephone Encounter (Signed)
Please have her check her blood pressure.  Dizziness could be due to uncontrolled hypertension versus hypotension with the addition of lisinopril.  If blood pressure is within normal range, okay to discontinue lisinopril and follow-up with PCP within the next 2 weeks for blood pressure recheck.  If it is low I certainly would discontinue the lisinopril.  However, if persistently high and having dizziness would get checked out in the emergency department as this could be hypertensive emergency.

## 2019-01-04 NOTE — Telephone Encounter (Signed)
Patient aware and verbalized understanding. °

## 2019-01-12 ENCOUNTER — Ambulatory Visit (INDEPENDENT_AMBULATORY_CARE_PROVIDER_SITE_OTHER): Payer: Medicare Other | Admitting: Family Medicine

## 2019-01-12 ENCOUNTER — Encounter: Payer: Self-pay | Admitting: Family Medicine

## 2019-01-12 DIAGNOSIS — I10 Essential (primary) hypertension: Secondary | ICD-10-CM | POA: Diagnosis not present

## 2019-01-12 MED ORDER — LOSARTAN POTASSIUM 25 MG PO TABS: 12.5000 mg | ORAL_TABLET | Freq: Every day | ORAL | 1 refills | Status: DC

## 2019-01-12 NOTE — Progress Notes (Signed)
Virtual Visit via telephone Note  I connected with Erica Conner on 01/12/19 at 0927 by telephone and verified that I am speaking with the correct person using two identifiers. Erica Conner is currently located at home and Butch Penny, daughter are currently with her during visit. The provider, Fransisca Kaufmann Addis Tuohy, MD is located in their office at time of visit.  Call ended at [phone]  I discussed the limitations, risks, security and privacy concerns of performing an evaluation and management service by telephone and the availability of in person appointments. I also discussed with the patient that there may be a patient responsible charge related to this service. The patient expressed understanding and agreed to proceed.   History and Present Illness: Hypertension Patient is currently on digoxin and verapamil, and their blood pressure today is [id number], and stopped lisinopril and increased to [phone]. patient did have lightheadedness and dizziness when starting the medication, she is having increased chronic cough over the past month. Patient denies headaches, blurred vision, chest pains, shortness of breath, or weakness. Denies any side effects from medication and is content with current medication.   1. Essential hypertension     Outpatient Encounter Medications as of 01/12/2019  Medication Sig  . aspirin 81 MG tablet Take 81 mg by mouth daily.  Marland Kitchen atorvastatin (LIPITOR) 40 MG tablet Take 1 tablet (40 mg total) by mouth daily.  . calcium carbonate 200 MG capsule Take 250 mg by mouth daily.  . Carboxymethylcellulose Sodium (EYE DROPS) 0.5 % SOLN Apply 1 drop to eye daily.  . Cholecalciferol (VITAMIN D) 2000 UNITS tablet Take 2,000 Units by mouth daily.  . digoxin (LANOXIN) 0.25 MG tablet Take 1 tablet (250 mcg total) by mouth daily.  . fish oil-omega-3 fatty acids 1000 MG capsule Take 2 g by mouth 2 (two) times daily.  Marland Kitchen levothyroxine (SYNTHROID) 88 MCG tablet Take 1 tablet (88 mcg  total) by mouth daily before breakfast.  . losartan (COZAAR) 25 MG tablet Take 0.5 tablets (12.5 mg total) by mouth daily.  . Multiple Vitamin (MULTIVITAMIN) tablet Take 1 tablet by mouth daily.  . verapamil (CALAN-SR) 240 MG CR tablet Take 1 tablet (240 mg total) by mouth at bedtime.  . [DISCONTINUED] lisinopril (ZESTRIL) 5 MG tablet Take 1 tablet (5 mg total) by mouth daily.   No facility-administered encounter medications on file as of 01/12/2019.    Review of Systems  Constitutional: Negative for chills and fever.  Eyes: Negative for visual disturbance.  Respiratory: Negative for chest tightness and shortness of breath.   Cardiovascular: Negative for chest pain and leg swelling.  Musculoskeletal: Negative for back pain and gait problem.  Skin: Negative for rash.  Neurological: Positive for dizziness and light-headedness. Negative for weakness, numbness and headaches.  Psychiatric/Behavioral: Negative for agitation and behavioral problems.  All other systems reviewed and are negative.   Observations/Objective: Patient's daughter   Assessment and Plan: Problem List Items Addressed This Visit      Cardiovascular and Mediastinum   Hypertension - Primary   Relevant Medications   losartan (COZAAR) 25 MG tablet      Will change from lisinopril to losartan, will do only half the dose because she has had some dizziness, I think part of it is she has to get used to being in the [id number] systolic and getting down into the 140s, I do not want to push her down too much because of her dizziness.  They are going to be very cautious about this.  Follow up plan: Return in about 4 weeks (around 02/09/2019), or if symptoms worsen or fail to improve, for htn.     I discussed the assessment and treatment plan with the patient. The patient was provided an opportunity to ask questions and all were answered. The patient agreed with the plan and demonstrated an understanding of the instructions.   The  patient was advised to call back or seek an in-person evaluation if the symptoms worsen or if the condition fails to improve as anticipated.  The above assessment and management plan was discussed with the patient. The patient verbalized understanding of and has agreed to the management plan. Patient is aware to call the clinic if symptoms persist or worsen. Patient is aware when to return to the clinic for a follow-up visit. Patient educated on when it is appropriate to go to the emergency department.    I provided 16 minutes of non-face-to-face time during this encounter.    Worthy Rancher, MD

## 2019-01-13 ENCOUNTER — Other Ambulatory Visit: Payer: Self-pay | Admitting: Family Medicine

## 2019-01-13 DIAGNOSIS — R933 Abnormal findings on diagnostic imaging of other parts of digestive tract: Secondary | ICD-10-CM

## 2019-01-13 DIAGNOSIS — R911 Solitary pulmonary nodule: Secondary | ICD-10-CM

## 2019-01-13 DIAGNOSIS — R9389 Abnormal findings on diagnostic imaging of other specified body structures: Secondary | ICD-10-CM

## 2019-01-17 ENCOUNTER — Telehealth: Payer: Self-pay | Admitting: Family Medicine

## 2019-01-17 NOTE — Telephone Encounter (Signed)
Daughter aware.

## 2019-01-17 NOTE — Telephone Encounter (Signed)
I would still get the CT, the pneumonia may have cleared but it looks like there was a possible nodule or tumor and that is what we are following up on.  Her symptoms may be cleared but she still may have a tumor there.

## 2019-02-01 ENCOUNTER — Ambulatory Visit (HOSPITAL_COMMUNITY)
Admission: RE | Admit: 2019-02-01 | Discharge: 2019-02-01 | Disposition: A | Discharge status: Home / Self Care | Payer: Medicare Other | Source: Ambulatory Visit | Attending: Family Medicine | Admitting: Family Medicine

## 2019-02-01 ENCOUNTER — Other Ambulatory Visit: Payer: Self-pay

## 2019-02-01 DIAGNOSIS — R9389 Abnormal findings on diagnostic imaging of other specified body structures: Secondary | ICD-10-CM | POA: Diagnosis present

## 2019-02-01 DIAGNOSIS — R933 Abnormal findings on diagnostic imaging of other parts of digestive tract: Secondary | ICD-10-CM | POA: Insufficient documentation

## 2019-02-01 DIAGNOSIS — R911 Solitary pulmonary nodule: Secondary | ICD-10-CM

## 2019-02-02 ENCOUNTER — Telehealth: Payer: Self-pay | Admitting: Family Medicine

## 2019-02-02 NOTE — Telephone Encounter (Signed)
Refer to lab note °

## 2019-02-04 NOTE — Telephone Encounter (Signed)
Based on the result is hard to tell if there are new spots that are cancer of the been stable from before, I think the best thing to do would be to go see a pulmonologist or lung doctor.  If they do not already have one then we can do a referral.

## 2019-02-07 ENCOUNTER — Other Ambulatory Visit: Payer: Self-pay | Admitting: *Deleted

## 2019-02-07 ENCOUNTER — Telehealth: Payer: Self-pay | Admitting: *Deleted

## 2019-02-07 DIAGNOSIS — R9389 Abnormal findings on diagnostic imaging of other specified body structures: Secondary | ICD-10-CM

## 2019-02-07 DIAGNOSIS — R933 Abnormal findings on diagnostic imaging of other parts of digestive tract: Secondary | ICD-10-CM

## 2019-02-07 DIAGNOSIS — R911 Solitary pulmonary nodule: Secondary | ICD-10-CM

## 2019-02-07 NOTE — Telephone Encounter (Signed)
The radiologist did compare it and said that it had not changed that much but just the way that it looked they were still very concerned about it and wanted repeating testing, it may be the same based on the CT scan but that is why they wanted more close testing and a pulmonologist would be able to look at it for sure.  The last 2 are looking better, send me more in 1 week for bps

## 2019-02-07 NOTE — Telephone Encounter (Signed)
Daughter called back and states that patient had MRIs and PET scans in 2009 and wants to know if someone can compare these with the new CT scan.  Daughter is worried about taking patient to pulmonologist or lung doctor due to age.   Daughter also states that patient was started on new BP medication Last 4 readings 163/65 166/63 137/74 157/71 Patient always has a cough and sees no changes to cough at this time.

## 2019-02-07 NOTE — Telephone Encounter (Signed)
Daughter aware of results and states she will discuss with patients son to see if the want to pursue any further testing or if they will want to see a pulmonologist or lung doctor.

## 2019-02-07 NOTE — Telephone Encounter (Signed)
Referral placed to pulmonology.  Daughter states that patient does not want to see until after 03/27/2019

## 2019-02-07 NOTE — Telephone Encounter (Signed)
Daughter aware of recommendations and will call back after talking to patient's son and patient to set up referral.

## 2019-02-17 ENCOUNTER — Telehealth: Payer: Self-pay | Admitting: Family Medicine

## 2019-02-17 NOTE — Telephone Encounter (Signed)
Please review and advise.

## 2019-02-18 NOTE — Telephone Encounter (Signed)
I agree that these are slightly high but I don't want to cause dizzines so we will allow to run slightly high for now, continue to monitor. Erica Pina, MD [facility] 02/18/2019, 7:57 AM

## 2019-02-18 NOTE — Telephone Encounter (Signed)
We did a referral to pulmonology and they can decide what form of imaging they want to do from there if they want to do a biopsy.  But she should go see the pulmonologist

## 2019-02-18 NOTE — Telephone Encounter (Signed)
Please advise about PET/CT. Patient had CT chest on 02/01/19, findings recommended further evaluation with a PET/CT.  I do not see where this has been ordered.

## 2019-02-18 NOTE — Telephone Encounter (Signed)
Daughter aware.  She has not heard anything about an appointment from the pulmonology office.  Can you check on this for them please.

## 2019-02-21 NOTE — Telephone Encounter (Signed)
Called Office and did not get an answer - phone just rang - calling to f/u on ref.

## 2019-02-23 NOTE — Telephone Encounter (Signed)
Erica Conner, please check on referral to pulmonologist and call daughter back.  Patient's daughter aware of instruction about blood pressure readings and monitoring.

## 2019-02-23 NOTE — Telephone Encounter (Signed)
Okay we will await any further recommendations on the pulmonology appointment. Please continue to monitor blood pressures, she does not have to monitor multiple times per day or anything like that but she can monitor it every other day, I am going to allow permissive hypertension for now on her because I do not want the dizziness. Caryl Pina, MD [facility] 02/23/2019, 12:22 PM

## 2019-02-23 NOTE — Telephone Encounter (Signed)
Spoke with Daughter - She has number to [facility] and she will call and check on where they are with scheduling.

## 2019-03-30 ENCOUNTER — Encounter: Payer: Self-pay | Admitting: Internal Medicine

## 2019-03-30 ENCOUNTER — Other Ambulatory Visit: Payer: Self-pay

## 2019-03-30 ENCOUNTER — Ambulatory Visit: Payer: Medicare Other | Admitting: Internal Medicine

## 2019-03-30 VITALS — BP 145/76 | HR 95 | Temp 98.0°F | Ht 64.0 in | Wt 116.8 lb

## 2019-03-30 DIAGNOSIS — R9389 Abnormal findings on diagnostic imaging of other specified body structures: Secondary | ICD-10-CM | POA: Diagnosis not present

## 2019-03-30 NOTE — Patient Instructions (Signed)
The patient should have follow up scheduled with myself in 6 months.   

## 2019-03-30 NOTE — Progress Notes (Signed)
Erica Conner    [id number]    1926/08/08  Primary Care Physician:Dettinger, Fransisca Kaufmann, MD  Referring Physician: Dettinger, Fransisca Kaufmann, MD [place],  [place] [id number] Reason for Consultation: lung nodule Date of Consultation: 03/30/2019  Chief complaint:   Chief Complaint  Patient presents with  . Consult    CT right lung nodule, dry cough, sob only walking long distances.     HPI: Breast cancer in 2009 treated with aromatase inhibitor, left sided mastectomy and radiation. Now in remission.   More recently had abdominal pain. Concern for bowel instruction and new RLL nodule. She was fatigued and coughing more in the fall. Treated for pneumonia with antibiotics. Had repeat CT scan of chest with persistent of RLL nodule which is why she is presenting today. Cough and fatigue got better. Has occasional chronic cough which is with eating and when she lays down before bed. Doesn't wake up coughing.   Also had some cough which resolved with changing blood pressure medications.   No fevers, chills, night sweats, weight loss. Appetite is good.   Social history:  Lives independently. Doesn't do stairs but does ADLs and simple house keeping.  Exposures: no pets at home.  Smoking history: never smoker. Passive smoke exposure through work place, ran a [employer].   Social History   Occupational History  . Not on file  Tobacco Use  . Smoking status: Never Smoker  . Smokeless tobacco: Never Used  Substance and Sexual Activity  . Alcohol use: No  . Drug use: No  . Sexual activity: Not Currently    Birth control/protection: Post-menopausal, Surgical    Relevant family history:  Family History  Problem Relation Age of Onset  . Hypertension Mother   . Heart disease Mother   . Heart disease Father   . Heart attack Father   . Cancer Sister        Pancreatic and liver cancer  . Cancer Brother        Colon cancer  . Cancer Other        Breast cancer     Past Medical History:  Diagnosis Date  . Ankle fracture, right 2000  . Breast cancer ([facility]) 04/2007   Left  . Cataracts, bilateral   . Hyperlipidemia   . Hypertension   . Hypothyroid   . SVT (supraventricular tachycardia) ([facility])   . Syncope     Past Surgical History:  Procedure Laterality Date  . ABDOMINAL HYSTERECTOMY    . APPENDECTOMY    . BREAST CYST EXCISION  1987   again left breast cancer in 2009  . CATARACT EXTRACTION Bilateral 2011  . MASTECTOMY Left 06/2008  . TONSILLECTOMY       Review of systems: Review of Systems  Constitutional: Negative for chills, fever, malaise/fatigue and weight loss.  HENT: Positive for congestion. Negative for sinus pain and sore throat.   Eyes: Negative for discharge and redness.  Respiratory: Positive for cough. Negative for hemoptysis, sputum production, shortness of breath and wheezing.   Cardiovascular: Negative for chest pain, palpitations and leg swelling.  Gastrointestinal: Negative for heartburn, nausea and vomiting.  Musculoskeletal: Negative for joint pain and myalgias.  Skin: Negative for rash.  Neurological: Negative for dizziness, tremors, focal weakness and headaches.  Endo/Heme/Allergies: Negative for environmental allergies.  Psychiatric/Behavioral: Negative for depression. The patient is not nervous/anxious.   All other systems reviewed and are negative.   Physical Exam: Blood pressure Marland Kitchen)  145/76, pulse 95, temperature 98 F (36.7 C), temperature source Temporal, height 5\' 4"  (1.626 m), weight 116 lb 12.8 oz (53 kg), SpO2 95 %. Gen:      No acute distress, well preserved 86 yo woman Lungs:    No increased respiratory effort, symmetric chest Cowing excursion, clear to auscultation bilaterally, no wheezes or crackles, mild kyphosis CV:         Regular rate and rhythm; no murmurs, rubs, or gallops.  No pedal edema Abd:      + bowel sounds; soft, non-tender; no distension MSK: no acute synovitis of DIP or PIP joints, no  mechanics hands.  Skin:      Warm and dry; no rashes, Venous varicosities. Neuro: HOH, ambulates independetly   Data Reviewed/Medical Decision Making:  Independent interpretation of tests: Imaging: . Review of patient's CT Chest images 2010 revealed RML atelectasis and air bronchograms in the medial segment which has progressed slowly since the last ten years and is re-demonstrated on CT Chest from Jan 2021.  There are waxing/waning nodular densities in the bilateral bases most consistent with recurrent aspiration. The patient's images have been independently reviewed by me.    PFTs: None on file.   Immunization status:  Immunization History  Administered Date(s) Administered  . Fluad Quad(high Dose 65+) 09/10/2018  . Influenza, High Dose Seasonal PF 10/07/2018  . Moderna SARS-COVID-2 Vaccination 01/19/2019, 02/16/2019  . Pneumococcal Conjugate-13 12/13/2018  . Pneumococcal Polysaccharide-23 10/15/1994, 12/24/2016    . I reviewed prior external note(s) from Dr. Warrick Parisian . I reviewed the result(s) of the labs and imaging as noted above.   Assessment:  Abnormal CT Chest - RML Atelectasis which has progressed slowly since the last ten years.  Bilateral lower lobe nodular densities R>L History of Breast Cancer treated with aromatase inhibitor, mastectomy and radiation therapy Chronic Cough - suspect recurrent aspiration  Plan/Recommendations:  I discussed in detail my plan of care and assessment with Ms. Heidenreich and her daughter. Differential for her abnormal CT Chest includes endobronchial lesion (likely slow growing given relatively slow progression in the last ten years,) persistent atelectasis from mucus, MAI infection, recurrent aspiration. Options include more aggressive management such as bronchoscopy with Transbronchial and possible endobronchial biopsies with BAL vs conservative management with following up clinically.  Ms. Greenhalgh has stated if she had cancer she would not want to  be treated. Would only repeat scans at this point if she has a significant clinical decompensation that would warrant treatment - discussed fevers, chills, night sweats, weight loss (other signs of disseminated MAI.) Patient and daughter prefer conservative management at this time. Discussed aspiration precautions as well.   We discussed disease management and progression at length today.   I spent 45 minutes in the care of this patient today including pre-charting, chart review, review of results, face-to-face care, coordination of care and communication with consultants etc.).   Return to Care: Return in about 6 months (around 09/30/2019), or cough, shortness of breath.  Lenice Llamas, MD [facility]  CC: Dettinger, Fransisca Kaufmann, MD

## 2019-05-20 ENCOUNTER — Other Ambulatory Visit: Payer: Self-pay | Admitting: Family Medicine

## 2019-05-20 DIAGNOSIS — I10 Essential (primary) hypertension: Secondary | ICD-10-CM

## 2019-06-15 ENCOUNTER — Encounter: Payer: Self-pay | Admitting: Family Medicine

## 2019-06-15 ENCOUNTER — Ambulatory Visit (INDEPENDENT_AMBULATORY_CARE_PROVIDER_SITE_OTHER): Payer: Medicare Other | Admitting: Family Medicine

## 2019-06-15 ENCOUNTER — Other Ambulatory Visit: Payer: Self-pay

## 2019-06-15 VITALS — BP 156/78 | HR 78 | Temp 97.5°F | Ht 64.0 in | Wt 117.0 lb

## 2019-06-15 DIAGNOSIS — E039 Hypothyroidism, unspecified: Secondary | ICD-10-CM | POA: Diagnosis not present

## 2019-06-15 DIAGNOSIS — E782 Mixed hyperlipidemia: Secondary | ICD-10-CM | POA: Diagnosis not present

## 2019-06-15 DIAGNOSIS — I10 Essential (primary) hypertension: Secondary | ICD-10-CM | POA: Diagnosis not present

## 2019-06-15 NOTE — Progress Notes (Signed)
BP (!) 156/78   Pulse 78   Temp (!) 97.5 F (36.4 C) (Temporal)   Ht _0  (1.626 m)   Wt 117 lb (53.1 kg)   BMI 20.08 kg/m    Subjective:   Patient ID: Erica Conner, female    DOB: 1926-06-14, 86 y.o.   MRN: [id number]  HPI: Erica Conner is a 86 y.o. female presenting on 06/15/2019 for Medical Management of Chronic Issues   HPI Hypothyroidism recheck Patient is coming in for thyroid recheck today as well. They deny any issues with hair changes or heat or cold problems or diarrhea or constipation. They deny any chest pain or palpitations. They are currently on levothyroxine 71mcrograms   Hypertension Patient is currently on digoxin and losartan and verapamil, and their blood pressure today is 142/83 and 156/78, her home readings are up higher and lower than that, due to her age and fall risk we are not going to be more aggressive. Patient denies any lightheadedness or dizziness. Patient denies headaches, blurred vision, chest pains, shortness of breath, or weakness. Denies any side effects from medication and is content with current medication.   Hyperlipidemia Patient is coming in for recheck of his hyperlipidemia. The patient is currently taking fish oils and atorvastatin. They deny any issues with myalgias or history of liver damage from it. They deny any focal numbness or weakness or chest pain.   Relevant past medical, surgical, family and social history reviewed and updated as indicated. Interim medical history since our last visit reviewed. Allergies and medications reviewed and updated.  Review of Systems  Constitutional: Negative for chills and fever.  Eyes: Negative for visual disturbance.  Respiratory: Negative for chest tightness and shortness of breath.   Cardiovascular: Negative for chest pain and leg swelling.  Musculoskeletal: Positive for arthralgias. Negative for back pain and gait problem.  Skin: Negative for rash.  Neurological: Negative for  light-headedness and headaches.  Psychiatric/Behavioral: Negative for agitation and behavioral problems.  All other systems reviewed and are negative.   Per HPI unless specifically indicated above   Allergies as of 06/15/2019      Reactions   Penicillins Hives   Sulfa Antibiotics Hives      Medication List       Accurate as of June 15, 2019  8:11 AM. If you have any questions, ask your nurse or doctor.        aspirin 81 MG tablet Take 81 mg by mouth daily.   atorvastatin 40 MG tablet Commonly known as: LIPITOR Take 1 tablet (40 mg total) by mouth daily.   calcium carbonate 200 MG capsule Take 250 mg by mouth daily.   digoxin 0.25 MG tablet Commonly known as: LANOXIN Take 1 tablet (250 mcg total) by mouth daily.   Eye Drops 0.5 % Soln Apply 1 drop to eye daily.   fish oil-omega-3 fatty acids 1000 MG capsule Take 2 g by mouth 2 (two) times daily.   levothyroxine 88 MCG tablet Commonly known as: SYNTHROID Take 1 tablet (88 mcg total) by mouth daily before breakfast.   losartan 25 MG tablet Commonly known as: COZAAR Take 1/2 (one-half) tablet by mouth once daily   multivitamin tablet Take 1 tablet by mouth daily.   verapamil 240 MG CR tablet Commonly known as: CALAN-SR Take 1 tablet (240 mg total) by mouth at bedtime.   Vitamin D 50 MCG (2000 UT) tablet Take 2,000 Units by mouth daily.  Objective:   BP (!) 156/78   Pulse 78   Temp (!) 97.5 F (36.4 C) (Temporal)   Ht _0  (1.626 m)   Wt 117 lb (53.1 kg)   BMI 20.08 kg/m   Wt Readings from Last 3 Encounters:  06/15/19 117 lb (53.1 kg)  03/30/19 116 lb 12.8 oz (53 kg)  12/13/18 115 lb 9.6 oz (52.4 kg)    Physical Exam Vitals and nursing note reviewed.  Constitutional:      General: She is not in acute distress.    Appearance: She is well-developed. She is not diaphoretic.  Eyes:     Conjunctiva/sclera: Conjunctivae normal.  Cardiovascular:     Rate and Rhythm: Normal rate and regular  rhythm.     Heart sounds: Normal heart sounds. No murmur.  Pulmonary:     Effort: Pulmonary effort is normal. No respiratory distress.     Breath sounds: Normal breath sounds. No wheezing.  Musculoskeletal:        General: No tenderness. Normal range of motion.     Comments: Patient has bruising and a slight a bit of soreness around her right great toe, appears to have stubbed it or fractured it, she is able to move it and sensations intact and capillary refill is intact  Skin:    General: Skin is warm and dry.     Findings: No rash.  Neurological:     Mental Status: She is alert and oriented to person, place, and time.     Coordination: Coordination normal.  Psychiatric:        Behavior: Behavior normal.       Assessment & Plan:   Problem List Items Addressed This Visit      Cardiovascular and Mediastinum   Hypertension   Relevant Orders   CBC with Differential/Platelet   CMP14+EGFR     Endocrine   Hypothyroidism - Primary   Relevant Orders   CBC with Differential/Platelet   TSH     Other   Hyperlipidemia   Relevant Orders   Lipid panel      Continue current medication, allowing permissive hypertension because of high risk for falls and instability the starting to increase.  She has not had any falls yet, gave list of exercises and strengthening to prevent falls. Follow up plan: Return in about 6 months (around 12/15/2019), or if symptoms worsen or fail to improve, for Thyroid and cholesterol recheck.  Counseling provided for all of the vaccine components Orders Placed This Encounter  Procedures  . CBC with Differential/Platelet  . CMP14+EGFR  . Lipid panel  . TSH    Caryl Pina, MD [facility] 06/15/2019, 8:11 AM

## 2019-06-16 LAB — CMP14+EGFR
ALT: 12 IU/L (ref 0–32)
AST: 23 IU/L (ref 0–40)
Albumin/Globulin Ratio: 1.7 (ref 1.2–2.2)
Albumin: 4.2 g/dL (ref 3.5–4.6)
Alkaline Phosphatase: 48 IU/L (ref 48–121)
BUN/Creatinine Ratio: 17 (ref 12–28)
BUN: 11 mg/dL (ref 10–36)
Bilirubin Total: 0.6 mg/dL (ref 0.0–1.2)
CO2: 23 mmol/L (ref 20–29)
Calcium: 9.5 mg/dL (ref 8.7–10.3)
Chloride: 97 mmol/L (ref 96–106)
Creatinine, Ser: 0.64 mg/dL (ref 0.57–1.00)
GFR calc Af Amer: 91 mL/min/{1.73_m2} (ref >59)
GFR calc non Af Amer: 79 mL/min/{1.73_m2} (ref >59)
Globulin, Total: 2.5 g/dL (ref 1.5–4.5)
Glucose: 99 mg/dL (ref 65–99)
Potassium: 4.8 mmol/L (ref 3.5–5.2)
Sodium: 139 mmol/L (ref 134–144)
Total Protein: 6.7 g/dL (ref 6.0–8.5)

## 2019-06-16 LAB — CBC WITH DIFFERENTIAL/PLATELET
Basophils Absolute: 0 10*3/uL (ref 0.0–0.2)
Basos: 0 %
EOS (ABSOLUTE): 0.1 10*3/uL (ref 0.0–0.4)
Eos: 1 %
Hematocrit: 43.1 % (ref 34.0–46.6)
Hemoglobin: 14.7 g/dL (ref 11.1–15.9)
Immature Grans (Abs): 0 10*3/uL (ref 0.0–0.1)
Immature Granulocytes: 0 %
Lymphocytes Absolute: 1.6 10*3/uL (ref 0.7–3.1)
Lymphs: 21 %
MCH: 32.2 pg (ref 26.6–33.0)
MCHC: 34.1 g/dL (ref 31.5–35.7)
MCV: 94 fL (ref 79–97)
Monocytes Absolute: 0.7 10*3/uL (ref 0.1–0.9)
Monocytes: 10 %
Neutrophils Absolute: 5.1 10*3/uL (ref 1.4–7.0)
Neutrophils: 68 %
Platelets: 284 10*3/uL (ref 150–450)
RBC: 4.57 x10E6/uL (ref 3.77–5.28)
RDW: 11.8 % (ref 11.7–15.4)
WBC: 7.6 10*3/uL (ref 3.4–10.8)

## 2019-06-16 LAB — LIPID PANEL
Chol/HDL Ratio: 2.8 ratio (ref 0.0–4.4)
Cholesterol, Total: 125 mg/dL (ref 100–199)
HDL: 44 mg/dL (ref >39)
LDL Chol Calc (NIH): 57 mg/dL (ref 0–99)
Triglycerides: 135 mg/dL (ref 0–149)
VLDL Cholesterol Cal: 24 mg/dL (ref 5–40)

## 2019-06-16 LAB — TSH: TSH: 2.32 u[IU]/mL (ref 0.450–4.500)

## 2019-08-24 ENCOUNTER — Other Ambulatory Visit: Payer: Self-pay | Admitting: Family Medicine

## 2019-08-24 DIAGNOSIS — E039 Hypothyroidism, unspecified: Secondary | ICD-10-CM

## 2019-08-24 DIAGNOSIS — I10 Essential (primary) hypertension: Secondary | ICD-10-CM

## 2019-08-24 DIAGNOSIS — E782 Mixed hyperlipidemia: Secondary | ICD-10-CM

## 2019-08-24 DIAGNOSIS — I499 Cardiac arrhythmia, unspecified: Secondary | ICD-10-CM

## 2019-08-25 ENCOUNTER — Ambulatory Visit (INDEPENDENT_AMBULATORY_CARE_PROVIDER_SITE_OTHER): Payer: Medicare Other | Admitting: Ophthalmology

## 2019-08-25 ENCOUNTER — Other Ambulatory Visit: Payer: Self-pay

## 2019-08-25 ENCOUNTER — Encounter (INDEPENDENT_AMBULATORY_CARE_PROVIDER_SITE_OTHER): Payer: Self-pay | Admitting: Ophthalmology

## 2019-08-25 DIAGNOSIS — H34812 Central retinal vein occlusion, left eye, with macular edema: Secondary | ICD-10-CM

## 2019-08-25 MED ORDER — BEVACIZUMAB CHEMO INJECTION 1.25MG/0.05ML SYRINGE FOR KALEIDOSCOPE
1.2500 mg | INTRAVITREAL | Status: AC | PRN
  Administered 2019-08-25: 1.25 mg via INTRAVITREAL

## 2019-08-25 NOTE — Assessment & Plan Note (Signed)
The nature of central retinal vein occlusion was discussed with the patient including the division of types into nonischemic ischemic. The potential sequelae of ischemic central retinal vein occlusion, including macular edema, neovascularization, rubeosis iridis, and neovascular glaucoma, were discussed, and the need for frequent follow-up.  The nature of macular edema and central retinal vein occlusion was discussed. The following options were considered:  1.Observation for a period to look for spontaneous improvement, is no linger the primary therapy. One-third worsen, one-third stay unchanged, and one-third improves.  2. Anti-VEGF Therapy. ( Lucentis, Avastin or Eylea ) injected  in intravitreal fashion, initially monthly then tailored to clinical response.  3. Intravitreal steroid usage, Kenalog, or Ozurdex, usually a second line therapy or in combination with anti-Vegf therapy noted above.  4. Panretinal laser photocoagulation to cause regression of iris neovascularization, or treat retinal  non-perfusion.  5. Surgical Management may include vitrectomy with incisions of peripheral veins to trigger retino choroidal anastomosis formation. This topic presented and discussed at [facility]. Recommend commence Avastin

## 2019-08-25 NOTE — Patient Instructions (Signed)
Patient instructed to not mash or rub the eye.  Patient to report promptly if new visual acuity decline or difficulties develop in either eye

## 2019-08-25 NOTE — Progress Notes (Signed)
08/25/2019     CHIEF COMPLAINT Patient presents for Blurred Vision   HISTORY OF PRESENT ILLNESS: Erica Conner is a 86 y.o. female who presents to the clinic today for:   HPI    Blurred Vision    In left eye.  Vision is missing.  Severity is moderate.  This started 3 months ago.  Occurring constantly.  It is worse throughout the day.  Context:  distance vision, mid-range vision and near vision.  Since onset it is gradually worsening.  Treatments tried include no treatments.  Response to treatment was no improvement.  I, the attending physician,  performed the HPI with the patient and updated documentation appropriately.          Comments    Pt referred by Dr. Gershon Crane for Macular Hemorrhage OS.   Pt states about 3 months ago OS vision started decreasing. Pt states she is missing the center of her OS vision. Daughter states pt fell and hit back of head a couple of months ago. It did break the skin but pt did not pass out or have concussion symptoms.       Last edited by Tilda Franco on 08/25/2019  1:09 PM. (History)      Referring physician: Dettinger, Fransisca Kaufmann, MD [place],   [id number]  HISTORICAL INFORMATION:   Selected notes from the MEDICAL RECORD NUMBER       CURRENT MEDICATIONS: Current Outpatient Medications (Ophthalmic Drugs)  Medication Sig  . Carboxymethylcellulose Sodium (EYE DROPS) 0.5 % SOLN Apply 1 drop to eye daily.   No current facility-administered medications for this visit. (Ophthalmic Drugs)   Current Outpatient Medications (Other)  Medication Sig  . aspirin 81 MG tablet Take 81 mg by mouth daily.  Marland Kitchen atorvastatin (LIPITOR) 40 MG tablet Take 1 tablet by mouth once daily  . calcium carbonate 200 MG capsule Take 250 mg by mouth daily.  . Cholecalciferol (VITAMIN D) 2000 UNITS tablet Take 2,000 Units by mouth daily.  . digoxin (LANOXIN) 0.25 MG tablet Take 1 tablet by mouth once daily  . EUTHYROX 88 MCG tablet TAKE 1 TABLET BY  MOUTH ONCE DAILY BEFORE BREAKFAST  . fish oil-omega-3 fatty acids 1000 MG capsule Take 2 g by mouth 2 (two) times daily.  Marland Kitchen losartan (COZAAR) 25 MG tablet Take 1/2 (one-half) tablet by mouth once daily  . Multiple Vitamin (MULTIVITAMIN) tablet Take 1 tablet by mouth daily.  . verapamil (CALAN-SR) 240 MG CR tablet TAKE 1 TABLET BY MOUTH AT BEDTIME   No current facility-administered medications for this visit. (Other)      REVIEW OF SYSTEMS:    ALLERGIES Allergies  Allergen Reactions  . Penicillins Hives  . Sulfa Antibiotics Hives    PAST MEDICAL HISTORY Past Medical History:  Diagnosis Date  . Ankle fracture, right 2000  . Breast cancer ([place]) 04/2007   Left  . Cataracts, bilateral   . Hyperlipidemia   . Hypertension   . Hypothyroid   . SVT (supraventricular tachycardia) ([place])   . Syncope    Past Surgical History:  Procedure Laterality Date  . ABDOMINAL HYSTERECTOMY    . APPENDECTOMY    . BREAST CYST EXCISION  1987   again left breast cancer in 2009  . CATARACT EXTRACTION Bilateral 2011  . MASTECTOMY Left 06/2008  . TONSILLECTOMY      FAMILY HISTORY Family History  Problem Relation Age of Onset  . Hypertension Mother   . Heart disease Mother   .  Heart disease Father   . Heart attack Father   . Cancer Sister        Pancreatic and liver cancer  . Cancer Brother        Colon cancer  . Cancer Other        Breast cancer    SOCIAL HISTORY Social History   Tobacco Use  . Smoking status: Never Smoker  . Smokeless tobacco: Never Used  Vaping Use  . Vaping Use: Never used  Substance Use Topics  . Alcohol use: No  . Drug use: No         OPHTHALMIC EXAM: Base Eye Exam    Visual Acuity (Snellen - Linear)      Right Left   Dist cc 20/30 -2 HM   Dist ph cc  NI   Correction: Glasses       Pupils      Pupils Dark Light Shape React   Right PERRL 6 6 Round Dilated   Left PERRL 6 6 Round Dilated       Visual Fields (Counting fingers)      Left  Right     Full   Restrictions Total superior temporal, inferior temporal, superior nasal, inferior nasal deficiencies   Poor understanding       Neuro/Psych    Oriented x3: Yes   Mood/Affect: Normal       Dilation    Left eye: 1.0% Mydriacyl, 2.5% Phenylephrine @ 1:15 PM        Slit Lamp and Fundus Exam    External Exam      Right Left   External Normal Normal       Slit Lamp Exam      Right Left   Lids/Lashes Normal Normal   Conjunctiva/Sclera White and quiet White and quiet   Cornea Clear Clear   Anterior Chamber Deep and quiet Deep and quiet   Iris Round and reactive Round and reactive   Lens Centered posterior chamber intraocular lens Centered posterior chamber intraocular lens   Anterior Vitreous Normal Normal       Fundus Exam      Right Left   Posterior Vitreous Normal    Disc Normal    C/D Ratio 0.5 0.6   Macula Normal Macular thickening, Hemorrhage, Cystoid macular edema, Microaneurysms   Vessels Normal CRVO, ischemic   Periphery Normal Diffuse retinal hemorrhages          IMAGING AND PROCEDURES  Imaging and Procedures for 08/25/19  OCT, Retina - OU - Both Eyes       Right Eye Quality was good. Scan locations included subfoveal. Central Foveal Thickness: 269. Progression has no prior data. Findings include normal foveal contour.   Left Eye Quality was poor. Scan locations included temporal. Progression has no prior data. Findings include epiretinal membrane, cystoid macular edema.   Notes ischemic central retinal vein occlusion, CME, OS       Color Fundus Photography Optos - OU - Both Eyes       Right Eye Progression has no prior data. Macula : normal observations. Vessels : normal observations. Periphery : normal observations.   Left Eye Progression has no prior data. Disc findings include normal observations. Macula : edema, microaneurysms, hemorrhage.   Notes Central retinal vein occlusion with multiple cotton-wool spots in the  posterior pole, some findings of ischemia with peripheral dot blot hemorrhages, confluent retinal hemorrhages in the macula       Intravitreal Injection, Pharmacologic Agent - OS -  Left Eye       Time Out 08/25/2019. 2:45 PM. Confirmed correct patient, procedure, site, and patient consented.   Anesthesia Anesthetic medications included [place] 3.5%.   Procedure Preparation included Ofloxacin , Tobramycin 0.3%, 10% betadine to eyelids, 5% betadine to ocular surface. A supplied needle was used.   Injection:  1.25 mg Bevacizumab (AVASTIN) SOLN   NDC: [address]-7, Lot: [id number]   Route: Intravitreal, Site: Left Eye, Waste: 0 mg  Post-op Post injection exam found visual acuity of at least counting fingers. The patient tolerated the procedure well. There were no complications. The patient received written and verbal post procedure care education. Post injection medications were not given.                 ASSESSMENT/PLAN:  Central retinal vein occlusion with macular edema of left eye The nature of central retinal vein occlusion was discussed with the patient including the division of types into nonischemic ischemic. The potential sequelae of ischemic central retinal vein occlusion, including macular edema, neovascularization, rubeosis iridis, and neovascular glaucoma, were discussed, and the need for frequent follow-up.  The nature of macular edema and central retinal vein occlusion was discussed. The following options were considered:  1.Observation for a period to look for spontaneous improvement, is no linger the primary therapy. One-third worsen, one-third stay unchanged, and one-third improves.  2. Anti-VEGF Therapy. ( Lucentis, Avastin or Eylea ) injected  in intravitreal fashion, initially monthly then tailored to clinical response.  3. Intravitreal steroid usage, Kenalog, or Ozurdex, usually a second line therapy or in combination with anti-Vegf therapy noted above.  4.  Panretinal laser photocoagulation to cause regression of iris neovascularization, or treat retinal  non-perfusion.  5. Surgical Management may include vitrectomy with incisions of peripheral veins to trigger retino choroidal anastomosis formation. This topic presented and discussed at [facility]. Recommend commence Avastin      ICD-10-CM   1. Central retinal vein occlusion with macular edema of left eye  H34.8120 OCT, Retina - OU - Both Eyes    Color Fundus Photography Optos - OU - Both Eyes    Intravitreal Injection, Pharmacologic Agent - OS - Left Eye    Bevacizumab (AVASTIN) SOLN 1.25 mg    1.  We will commence with intravitreal Avastin OS today  2.  Follow-up examination in 5 to 6 weeks  3.  Ophthalmic Meds Ordered this visit:  Meds ordered this encounter  Medications  . Bevacizumab (AVASTIN) SOLN 1.25 mg       Return in about 5 weeks (around 09/29/2019) for dilate, OS, AVASTIN OCT.  There are no Patient Instructions on file for this visit.   Explained the diagnoses, plan, and follow up with the patient and they expressed understanding.  Patient expressed understanding of the importance of proper follow up care.   Clent Demark Ascencion Stegner M.D. Diseases & Surgery of the Retina and Vitreous Retina & Diabetic [place] 08/25/19     Abbreviations: M myopia (nearsighted); A astigmatism; H hyperopia (farsighted); P presbyopia; Mrx spectacle prescription;  CTL contact lenses; OD right eye; OS left eye; OU both eyes  XT exotropia; ET esotropia; PEK punctate epithelial keratitis; PEE punctate epithelial erosions; DES dry eye syndrome; MGD meibomian gland dysfunction; ATs artificial tears; PFAT's preservative free artificial tears; [place] nuclear sclerotic cataract; PSC posterior subcapsular cataract; ERM epi-retinal membrane; PVD posterior vitreous detachment; RD retinal detachment; DM diabetes mellitus; DR diabetic retinopathy; NPDR non-proliferative diabetic retinopathy; PDR proliferative  diabetic retinopathy; CSME clinically significant macular  edema; DME diabetic macular edema; dbh dot blot hemorrhages; CWS cotton wool spot; POAG primary open angle glaucoma; C/D cup-to-disc ratio; HVF humphrey visual field; GVF goldmann visual field; OCT optical coherence tomography; IOP intraocular pressure; BRVO Branch retinal vein occlusion; CRVO central retinal vein occlusion; CRAO central retinal artery occlusion; BRAO branch retinal artery occlusion; RT retinal tear; SB scleral buckle; PPV pars plana vitrectomy; VH Vitreous hemorrhage; PRP panretinal laser photocoagulation; IVK intravitreal kenalog; VMT vitreomacular traction; MH Macular hole;  NVD neovascularization of the disc; NVE neovascularization elsewhere; AREDS age related eye disease study; ARMD age related macular degeneration; POAG primary open angle glaucoma; EBMD epithelial/anterior basement membrane dystrophy; ACIOL anterior chamber intraocular lens; IOL intraocular lens; PCIOL posterior chamber intraocular lens; Phaco/IOL phacoemulsification with intraocular lens placement; [place] photorefractive keratectomy; LASIK laser assisted in situ keratomileusis; HTN hypertension; DM diabetes mellitus; COPD chronic obstructive pulmonary disease

## 2019-10-03 ENCOUNTER — Encounter (INDEPENDENT_AMBULATORY_CARE_PROVIDER_SITE_OTHER): Payer: Self-pay | Admitting: Ophthalmology

## 2019-10-03 ENCOUNTER — Other Ambulatory Visit: Payer: Self-pay

## 2019-10-03 ENCOUNTER — Ambulatory Visit (INDEPENDENT_AMBULATORY_CARE_PROVIDER_SITE_OTHER): Payer: Medicare Other | Admitting: Ophthalmology

## 2019-10-03 DIAGNOSIS — H34812 Central retinal vein occlusion, left eye, with macular edema: Secondary | ICD-10-CM

## 2019-10-03 MED ORDER — BEVACIZUMAB CHEMO INJECTION 1.25MG/0.05ML SYRINGE FOR KALEIDOSCOPE
1.2500 mg | INTRAVITREAL | Status: AC | PRN
Start: 2019-10-03 — End: 2019-10-03
  Administered 2019-10-03: 1.25 mg via INTRAVITREAL

## 2019-10-03 NOTE — Patient Instructions (Signed)
Clear of new visual acuity difficulties or decline develops

## 2019-10-03 NOTE — Progress Notes (Signed)
10/03/2019     CHIEF COMPLAINT Patient presents for Retina Follow Up   HISTORY OF PRESENT ILLNESS: Erica Conner is a 86 y.o. female who presents to the clinic today for:   HPI    Retina Follow Up    Patient presents with  CRVO/BRVO.  In left eye.  This started 6 weeks ago.  Severity is mild.  Duration of 6 weeks.  Since onset it is stable.          Comments    6 Week CRVO F/U OS, poss Avastin OS  Pt denies noticeable changes to [place] OU since last visit. Pt denies ocular pain, flashes of light, or floaters OU.         Last edited by Rockie Neighbours, [place] on 10/03/2019  1:05 PM. (History)      Referring physician: Dettinger, Fransisca Kaufmann, MD [place],  [place] [id number]  HISTORICAL INFORMATION:   Selected notes from the MEDICAL RECORD NUMBER       CURRENT MEDICATIONS: Current Outpatient Medications (Ophthalmic Drugs)  Medication Sig  . Carboxymethylcellulose Sodium (EYE DROPS) 0.5 % SOLN Apply 1 drop to eye daily.   No current facility-administered medications for this visit. (Ophthalmic Drugs)   Current Outpatient Medications (Other)  Medication Sig  . aspirin 81 MG tablet Take 81 mg by mouth daily.  Marland Kitchen atorvastatin (LIPITOR) 40 MG tablet Take 1 tablet by mouth once daily  . calcium carbonate 200 MG capsule Take 250 mg by mouth daily.  . Cholecalciferol (VITAMIN D) 2000 UNITS tablet Take 2,000 Units by mouth daily.  . digoxin (LANOXIN) 0.25 MG tablet Take 1 tablet by mouth once daily  . EUTHYROX 88 MCG tablet TAKE 1 TABLET BY MOUTH ONCE DAILY BEFORE BREAKFAST  . fish oil-omega-3 fatty acids 1000 MG capsule Take 2 g by mouth 2 (two) times daily.  Marland Kitchen losartan (COZAAR) 25 MG tablet Take 1/2 (one-half) tablet by mouth once daily  . Multiple Vitamin (MULTIVITAMIN) tablet Take 1 tablet by mouth daily.  . verapamil (CALAN-SR) 240 MG CR tablet TAKE 1 TABLET BY MOUTH AT BEDTIME   No current facility-administered medications for this visit. (Other)      REVIEW OF  SYSTEMS: ROS    Negative for: Constitutional, Gastrointestinal, Neurological, Skin, Genitourinary, Musculoskeletal, HENT, Endocrine, Cardiovascular, Eyes, Respiratory, Psychiatric, Allergic/Imm, Heme/Lymph   Last edited by Rockie Neighbours, COA on 10/03/2019  1:05 PM. (History)       ALLERGIES Allergies  Allergen Reactions  . Penicillins Hives  . Sulfa Antibiotics Hives    PAST MEDICAL HISTORY Past Medical History:  Diagnosis Date  . Ankle fracture, right 2000  . Breast cancer ([place]) 04/2007   Left  . Cataracts, bilateral   . Hyperlipidemia   . Hypertension   . Hypothyroid   . SVT (supraventricular tachycardia) ([place])   . Syncope    Past Surgical History:  Procedure Laterality Date  . ABDOMINAL HYSTERECTOMY    . APPENDECTOMY    . BREAST CYST EXCISION  1987   again left breast cancer in 2009  . CATARACT EXTRACTION Bilateral 2011  . MASTECTOMY Left 06/2008  . TONSILLECTOMY      FAMILY HISTORY Family History  Problem Relation Age of Onset  . Hypertension Mother   . Heart disease Mother   . Heart disease Father   . Heart attack Father   . Cancer Sister        Pancreatic and liver cancer  . Cancer Brother  Colon cancer  . Cancer Other        Breast cancer    SOCIAL HISTORY Social History   Tobacco Use  . Smoking status: Never Smoker  . Smokeless tobacco: Never Used  Vaping Use  . Vaping Use: Never used  Substance Use Topics  . Alcohol use: No  . Drug use: No         OPHTHALMIC EXAM:  Base Eye Exam    Visual Acuity (ETDRS)      Right Left   Dist cc 20/40 -2 HM   Dist ph cc 20/30 -1 NI   Correction: Glasses       Tonometry (Tonopen, 1:06 PM)      Right Left   Pressure 14 13       Pupils      Dark Light Shape React APD   Right 4 3 Round Brisk None   Left 4 3 Round Brisk +1       Visual Fields (Counting fingers)      Left Right     Full   Restrictions Total superior temporal, inferior temporal, superior nasal, inferior nasal  deficiencies        Extraocular Movement      Right Left    Full Full       Neuro/Psych    Oriented x3: Yes   Mood/Affect: Normal       Dilation    Left eye: 1.0% Mydriacyl, 2.5% Phenylephrine @ 1:08 PM        Slit Lamp and Fundus Exam    External Exam      Right Left   External Normal Normal       Slit Lamp Exam      Right Left   Lids/Lashes Normal Normal   Conjunctiva/Sclera White and quiet White and quiet   Cornea Clear Clear   Anterior Chamber Deep and quiet Deep and quiet   Iris Round and reactive Round and reactive   Lens Centered posterior chamber intraocular lens Centered posterior chamber intraocular lens   Anterior Vitreous Normal Normal       Fundus Exam      Right Left   Posterior Vitreous  Posterior vitreous detachment   Disc  Normal   C/D Ratio  0.6   Macula  Macular thickening, Hemorrhage, Cystoid macular edema, Microaneurysms   Vessels  CRVO, ischemic   Periphery  Diffuse retinal hemorrhages          IMAGING AND PROCEDURES  Imaging and Procedures for 10/03/19  OCT, Retina - OU - Both Eyes       Right Eye Quality was good. Scan locations included subfoveal. Central Foveal Thickness: 262. Progression has been stable.   Left Eye Quality was good. Scan locations included subfoveal. Central Foveal Thickness: 243. Progression has improved. Findings include central retinal atrophy, outer retinal atrophy, inner retinal atrophy.   Notes Much less retinal edema, now residual atrophy from central retinal vein occlusion left eye status post Avastin #1, will repeat today                ASSESSMENT/PLAN:  Central retinal vein occlusion with macular edema of left eye Proved, status post intravitreal Avastin OS #1, with retinal edema yet active clinical CRV O by examination      ICD-10-CM   1. Central retinal vein occlusion with macular edema of left eye  H34.8120 OCT, Retina - OU - Both Eyes    1.  Repeat intravitreal Avastin OS today  at 6-week interval and examination again in 6 weeks  2.  3.  Ophthalmic Meds Ordered this visit:  No orders of the defined types were placed in this encounter.      Return in about 6 weeks (around 11/14/2019) for dilate, OS, AVASTIN OCT.  Patient Instructions  Clear of new visual acuity difficulties or decline develops    Explained the diagnoses, plan, and follow up with the patient and they expressed understanding.  Patient expressed understanding of the importance of proper follow up care.   Clent Demark Inis Borneman M.D. Diseases & Surgery of the Retina and Vitreous Retina & Diabetic [place] 10/03/19     Abbreviations: M myopia (nearsighted); A astigmatism; H hyperopia (farsighted); P presbyopia; Mrx spectacle prescription;  CTL contact lenses; OD right eye; OS left eye; OU both eyes  XT exotropia; ET esotropia; PEK punctate epithelial keratitis; PEE punctate epithelial erosions; DES dry eye syndrome; MGD meibomian gland dysfunction; ATs artificial tears; PFAT's preservative free artificial tears; [place] nuclear sclerotic cataract; PSC posterior subcapsular cataract; ERM epi-retinal membrane; PVD posterior vitreous detachment; RD retinal detachment; DM diabetes mellitus; DR diabetic retinopathy; NPDR non-proliferative diabetic retinopathy; PDR proliferative diabetic retinopathy; CSME clinically significant macular edema; DME diabetic macular edema; dbh dot blot hemorrhages; CWS cotton wool spot; POAG primary open angle glaucoma; C/D cup-to-disc ratio; HVF humphrey visual field; GVF goldmann visual field; OCT optical coherence tomography; IOP intraocular pressure; BRVO Branch retinal vein occlusion; CRVO central retinal vein occlusion; CRAO central retinal artery occlusion; BRAO branch retinal artery occlusion; RT retinal tear; SB scleral buckle; PPV pars plana vitrectomy; VH Vitreous hemorrhage; PRP panretinal laser photocoagulation; IVK intravitreal kenalog; VMT vitreomacular traction; MH  Macular hole;  NVD neovascularization of the disc; NVE neovascularization elsewhere; AREDS age related eye disease study; ARMD age related macular degeneration; POAG primary open angle glaucoma; EBMD epithelial/anterior basement membrane dystrophy; ACIOL anterior chamber intraocular lens; IOL intraocular lens; PCIOL posterior chamber intraocular lens; Phaco/IOL phacoemulsification with intraocular lens placement; [place] photorefractive keratectomy; LASIK laser assisted in situ keratomileusis; HTN hypertension; DM diabetes mellitus; COPD chronic obstructive pulmonary disease

## 2019-10-03 NOTE — Assessment & Plan Note (Signed)
Proved, status post intravitreal Avastin OS #1, with retinal edema yet active clinical CRV O by examination

## 2019-11-14 ENCOUNTER — Other Ambulatory Visit: Payer: Self-pay

## 2019-11-14 ENCOUNTER — Encounter (INDEPENDENT_AMBULATORY_CARE_PROVIDER_SITE_OTHER): Payer: Self-pay | Admitting: Ophthalmology

## 2019-11-14 ENCOUNTER — Encounter (INDEPENDENT_AMBULATORY_CARE_PROVIDER_SITE_OTHER): Payer: Medicare Other | Admitting: Ophthalmology

## 2019-11-14 ENCOUNTER — Ambulatory Visit (INDEPENDENT_AMBULATORY_CARE_PROVIDER_SITE_OTHER): Payer: Medicare Other | Admitting: Ophthalmology

## 2019-11-14 DIAGNOSIS — H34812 Central retinal vein occlusion, left eye, with macular edema: Secondary | ICD-10-CM | POA: Diagnosis not present

## 2019-11-14 MED ORDER — BEVACIZUMAB CHEMO INJECTION 1.25MG/0.05ML SYRINGE FOR KALEIDOSCOPE
1.2500 mg | INTRAVITREAL | Status: AC | PRN
  Administered 2019-11-14: 1.25 mg via INTRAVITREAL

## 2019-11-14 NOTE — Progress Notes (Signed)
11/14/2019     CHIEF COMPLAINT Patient presents for Retina Follow Up   HISTORY OF PRESENT ILLNESS: Erica Conner is a 86 y.o. female who presents to the clinic today for:   HPI    Retina Follow Up    Patient presents with  CRVO/BRVO.  In left eye.  This started 6 weeks ago.  Severity is mild.  Duration of 6 weeks.  Since onset it is stable.          Comments    6 Week F/U OS, poss Avastin OS  Pt denies noticeable changes to [place] OU since last visit. Pt denies ocular pain, flashes of light, or floaters OU.         Last edited by Rockie Neighbours, Berger on 11/14/2019  9:31 AM. (History)      Referring physician: Dettinger, Fransisca Kaufmann, MD [place],   [id number]  HISTORICAL INFORMATION:   Selected notes from the MEDICAL RECORD NUMBER       CURRENT MEDICATIONS: Current Outpatient Medications (Ophthalmic Drugs)  Medication Sig  . Carboxymethylcellulose Sodium (EYE DROPS) 0.5 % SOLN Apply 1 drop to eye daily.   No current facility-administered medications for this visit. (Ophthalmic Drugs)   Current Outpatient Medications (Other)  Medication Sig  . aspirin 81 MG tablet Take 81 mg by mouth daily.  Marland Kitchen atorvastatin (LIPITOR) 40 MG tablet Take 1 tablet by mouth once daily  . calcium carbonate 200 MG capsule Take 250 mg by mouth daily.  . Cholecalciferol (VITAMIN D) 2000 UNITS tablet Take 2,000 Units by mouth daily.  . digoxin (LANOXIN) 0.25 MG tablet Take 1 tablet by mouth once daily  . EUTHYROX 88 MCG tablet TAKE 1 TABLET BY MOUTH ONCE DAILY BEFORE BREAKFAST  . fish oil-omega-3 fatty acids 1000 MG capsule Take 2 g by mouth 2 (two) times daily.  Marland Kitchen losartan (COZAAR) 25 MG tablet Take 1/2 (one-half) tablet by mouth once daily  . Multiple Vitamin (MULTIVITAMIN) tablet Take 1 tablet by mouth daily.  . verapamil (CALAN-SR) 240 MG CR tablet TAKE 1 TABLET BY MOUTH AT BEDTIME   No current facility-administered medications for this visit. (Other)      REVIEW OF  SYSTEMS:    ALLERGIES Allergies  Allergen Reactions  . Penicillins Hives  . Sulfa Antibiotics Hives    PAST MEDICAL HISTORY Past Medical History:  Diagnosis Date  . Ankle fracture, right 2000  . Breast cancer ([place]) 04/2007   Left  . Cataracts, bilateral   . Hyperlipidemia   . Hypertension   . Hypothyroid   . SVT (supraventricular tachycardia) ([place])   . Syncope    Past Surgical History:  Procedure Laterality Date  . ABDOMINAL HYSTERECTOMY    . APPENDECTOMY    . BREAST CYST EXCISION  1987   again left breast cancer in 2009  . CATARACT EXTRACTION Bilateral 2011  . MASTECTOMY Left 06/2008  . TONSILLECTOMY      FAMILY HISTORY Family History  Problem Relation Age of Onset  . Hypertension Mother   . Heart disease Mother   . Heart disease Father   . Heart attack Father   . Cancer Sister        Pancreatic and liver cancer  . Cancer Brother        Colon cancer  . Cancer Other        Breast cancer    SOCIAL HISTORY Social History   Tobacco Use  . Smoking status: Never Smoker  .  Smokeless tobacco: Never Used  Vaping Use  . Vaping Use: Never used  Substance Use Topics  . Alcohol use: No  . Drug use: No         OPHTHALMIC EXAM: Base Eye Exam    Visual Acuity (ETDRS)      Right Left   Dist cc 20/30 -3 CF @ face   Dist ph cc 20/25 -2 NI   Correction: Glasses       Tonometry (Tonopen, 9:32 AM)      Right Left   Pressure 13 15       Pupils      Dark Light Shape React APD   Right 4 3 Round Brisk None   Left 4 3 Round Brisk +1       Visual Fields (Counting fingers)      Left Right     Full   Restrictions Total superior temporal, inferior temporal, superior nasal, inferior nasal deficiencies        Extraocular Movement      Right Left    Full Full       Neuro/Psych    Oriented x3: Yes   Mood/Affect: Normal       Dilation    Left eye: 1.0% Mydriacyl, 2.5% Phenylephrine @ 9:35 AM        Slit Lamp and Fundus Exam    External Exam       Right Left   External Normal Normal       Slit Lamp Exam      Right Left   Lids/Lashes Normal Normal   Conjunctiva/Sclera White and quiet White and quiet   Cornea Clear Clear   Anterior Chamber Deep and quiet Deep and quiet   Iris Round and reactive Round and reactive   Lens Centered posterior chamber intraocular lens Centered posterior chamber intraocular lens   Anterior Vitreous Normal Normal       Fundus Exam      Right Left   Posterior Vitreous  Posterior vitreous detachment   Disc  Normal   C/D Ratio  0.6   Macula  Macular thickening, Hemorrhage, Cystoid macular edema, Microaneurysms   Vessels  CRVO, ischemic   Periphery  Diffuse retinal hemorrhages          IMAGING AND PROCEDURES  Imaging and Procedures for 11/14/19  OCT, Retina - OU - Both Eyes       Right Eye Quality was good. Scan locations included subfoveal. Central Foveal Thickness: 267. Progression has been stable. Findings include normal foveal contour.   Left Eye Quality was good. Scan locations included subfoveal. Central Foveal Thickness: 250. Progression has improved.   Notes Ischemic retina left eye, improved CME on intravitreal Avastin. I explained to the patient and family that this is the maximal visual acuity potential and the goal is to prevent loss of the globe due to neovascular glaucoma if this condition were untreated       Intravitreal Injection, Pharmacologic Agent - OS - Left Eye       Time Out 11/14/2019. 10:00 AM. Confirmed correct patient, procedure, site, and patient consented.   Anesthesia Topical anesthesia was used. Anesthetic medications included Akten 3.5%.   Procedure Preparation included Ofloxacin , Tobramycin 0.3%, 10% betadine to eyelids, 5% betadine to ocular surface. A 30 gauge needle was used.   Injection:  1.25 mg Bevacizumab (AVASTIN) SOLN   NDC: 70360-001-02, Lot: [id number]   Route: Intravitreal, Site: Left Eye, Waste: 0 mg  Post-op Post injection exam  found visual acuity of at least counting fingers. The patient tolerated the procedure well. There were no complications. The patient received written and verbal post procedure care education. Post injection medications were not given.                 ASSESSMENT/PLAN:  Central retinal vein occlusion with macular edema of left eye Ischemic central retinal vein occlusion left eye, improved macular thickening as compared to onset 08-25-19. Nonetheless loss of foveal macular architecture and outer foveal scarring seen on OCT suggest that her maximal visual potential has been achieved  Goal is to prevent progression of ischemic disease to neovascular glaucoma and the potential for discomfort in blinding eye disease and even loss of the globe.  I explained that we will minimize the number of visits but yet maximize her treatment to keep her eye stable      ICD-10-CM   1. Central retinal vein occlusion with macular edema of left eye  H34.8120 OCT, Retina - OU - Both Eyes    Intravitreal Injection, Pharmacologic Agent - OS - Left Eye    Bevacizumab (AVASTIN) SOLN 1.25 mg    1. Repeat intravitreal Avastin OS today to to continue to control the cystoid macular edema component of the disease. Retinal nonperfusion in the macular accounts for acuity. This will not improve    2.  Follow-up in 8 weeks and consider but repeat intravitreal Avastin OS. Goal is to prevent neovascular disease progression  3.  Ophthalmic Meds Ordered this visit:  Meds ordered this encounter  Medications  . Bevacizumab (AVASTIN) SOLN 1.25 mg       Return in about 8 weeks (around 01/09/2020) for dilate, OS, AVASTIN OCT.  There are no Patient Instructions on file for this visit.   Explained the diagnoses, plan, and follow up with the patient and they expressed understanding.  Patient expressed understanding of the importance of proper follow up care.   Clent Demark Shernell Saldierna M.D. Diseases & Surgery of the Retina and  Vitreous Retina & Diabetic [place] 11/14/19     Abbreviations: M myopia (nearsighted); A astigmatism; H hyperopia (farsighted); P presbyopia; Mrx spectacle prescription;  CTL contact lenses; OD right eye; OS left eye; OU both eyes  XT exotropia; ET esotropia; PEK punctate epithelial keratitis; PEE punctate epithelial erosions; DES dry eye syndrome; MGD meibomian gland dysfunction; ATs artificial tears; PFAT's preservative free artificial tears; [place] nuclear sclerotic cataract; PSC posterior subcapsular cataract; ERM epi-retinal membrane; PVD posterior vitreous detachment; RD retinal detachment; DM diabetes mellitus; DR diabetic retinopathy; NPDR non-proliferative diabetic retinopathy; PDR proliferative diabetic retinopathy; CSME clinically significant macular edema; DME diabetic macular edema; dbh dot blot hemorrhages; CWS cotton wool spot; POAG primary open angle glaucoma; C/D cup-to-disc ratio; HVF humphrey visual field; GVF goldmann visual field; OCT optical coherence tomography; IOP intraocular pressure; BRVO Branch retinal vein occlusion; CRVO central retinal vein occlusion; CRAO central retinal artery occlusion; BRAO branch retinal artery occlusion; RT retinal tear; SB scleral buckle; PPV pars plana vitrectomy; VH Vitreous hemorrhage; PRP panretinal laser photocoagulation; IVK intravitreal kenalog; VMT vitreomacular traction; MH Macular hole;  NVD neovascularization of the disc; NVE neovascularization elsewhere; AREDS age related eye disease study; ARMD age related macular degeneration; POAG primary open angle glaucoma; EBMD epithelial/anterior basement membrane dystrophy; ACIOL anterior chamber intraocular lens; IOL intraocular lens; PCIOL posterior chamber intraocular lens; Phaco/IOL phacoemulsification with intraocular lens placement; [place] photorefractive keratectomy; LASIK laser assisted in situ keratomileusis; HTN hypertension; DM diabetes mellitus; COPD chronic obstructive pulmonary disease

## 2019-11-14 NOTE — Assessment & Plan Note (Addendum)
Ischemic central retinal vein occlusion left eye, improved macular thickening as compared to onset 08-25-19. Nonetheless loss of foveal macular architecture and outer foveal scarring seen on OCT suggest that her maximal visual potential has been achieved  Goal is to prevent progression of ischemic disease to neovascular glaucoma and the potential for discomfort in blinding eye disease and even loss of the globe.  I explained that we will minimize the number of visits but yet maximize her treatment to keep her eye stable

## 2019-11-25 ENCOUNTER — Other Ambulatory Visit: Payer: Self-pay | Admitting: Family Medicine

## 2019-11-25 DIAGNOSIS — E039 Hypothyroidism, unspecified: Secondary | ICD-10-CM

## 2019-11-25 DIAGNOSIS — I499 Cardiac arrhythmia, unspecified: Secondary | ICD-10-CM

## 2019-11-25 DIAGNOSIS — I10 Essential (primary) hypertension: Secondary | ICD-10-CM

## 2019-11-25 DIAGNOSIS — E782 Mixed hyperlipidemia: Secondary | ICD-10-CM

## 2019-12-09 ENCOUNTER — Telehealth: Payer: Self-pay

## 2019-12-09 NOTE — Telephone Encounter (Signed)
Okay to go ahead and write note for the patient.

## 2019-12-09 NOTE — Telephone Encounter (Signed)
Letter written and placed up front for pick up. Daughter notified

## 2020-01-09 ENCOUNTER — Encounter (INDEPENDENT_AMBULATORY_CARE_PROVIDER_SITE_OTHER): Payer: Medicare Other | Admitting: Ophthalmology

## 2020-01-11 ENCOUNTER — Encounter (INDEPENDENT_AMBULATORY_CARE_PROVIDER_SITE_OTHER): Payer: Medicare Other | Admitting: Ophthalmology

## 2020-01-12 ENCOUNTER — Ambulatory Visit: Payer: Medicare Other | Admitting: Family Medicine

## 2020-01-17 ENCOUNTER — Other Ambulatory Visit: Payer: Self-pay

## 2020-01-17 ENCOUNTER — Encounter (INDEPENDENT_AMBULATORY_CARE_PROVIDER_SITE_OTHER): Payer: Self-pay | Admitting: Ophthalmology

## 2020-01-17 ENCOUNTER — Ambulatory Visit (INDEPENDENT_AMBULATORY_CARE_PROVIDER_SITE_OTHER): Payer: Medicare Other | Admitting: Ophthalmology

## 2020-01-17 DIAGNOSIS — H34812 Central retinal vein occlusion, left eye, with macular edema: Secondary | ICD-10-CM

## 2020-01-17 MED ORDER — BEVACIZUMAB 2.5 MG/0.1ML IZ SOSY
2.5000 mg | PREFILLED_SYRINGE | INTRAVITREAL | Status: AC | PRN
  Administered 2020-01-17: 2.5 mg via INTRAVITREAL

## 2020-01-17 NOTE — Assessment & Plan Note (Signed)
Diffuse retinal and macular atrophy persists, currently at 8-week interval post Avastin we will repeat injection today and evaluate in 10 weeks

## 2020-01-17 NOTE — Progress Notes (Signed)
01/17/2020     CHIEF COMPLAINT Patient presents for Retina Follow Up (8 WK FU OS, POSS AVASTIN OS////Pt reports stable vision OS, no new F/F OS, no pain or pressure OS. )   HISTORY OF PRESENT ILLNESS: Erica Conner is a 87 y.o. female who presents to the clinic today for:   HPI    Retina Follow Up    Patient presents with  CRVO/BRVO.  In left eye.  This started 8 weeks ago.  Duration of 8 weeks.  Since onset it is stable. Additional comments: 8 WK FU OS, POSS AVASTIN OS    Pt reports stable vision OS, no new F/F OS, no pain or pressure OS.        Last edited by Nichola Sizer D on 01/17/2020  9:58 AM. (History)      Referring physician: Dettinger, Fransisca Kaufmann, MD [place],  [place] [id number]  HISTORICAL INFORMATION:   Selected notes from the MEDICAL RECORD NUMBER       CURRENT MEDICATIONS: Current Outpatient Medications (Ophthalmic Drugs)  Medication Sig  . Carboxymethylcellulose Sodium (EYE DROPS) 0.5 % SOLN Apply 1 drop to eye daily.   No current facility-administered medications for this visit. (Ophthalmic Drugs)   Current Outpatient Medications (Other)  Medication Sig  . aspirin 81 MG tablet Take 81 mg by mouth daily.  Marland Kitchen atorvastatin (LIPITOR) 40 MG tablet Take 1 tablet by mouth once daily  . calcium carbonate 200 MG capsule Take 250 mg by mouth daily.  . Cholecalciferol (VITAMIN D) 2000 UNITS tablet Take 2,000 Units by mouth daily.  . digoxin (LANOXIN) 0.25 MG tablet Take 1 tablet by mouth once daily  . EUTHYROX 88 MCG tablet TAKE 1 TABLET BY MOUTH ONCE DAILY BEFORE BREAKFAST  . fish oil-omega-3 fatty acids 1000 MG capsule Take 2 g by mouth 2 (two) times daily.  Marland Kitchen losartan (COZAAR) 25 MG tablet Take 1/2 (one-half) tablet by mouth once daily  . Multiple Vitamin (MULTIVITAMIN) tablet Take 1 tablet by mouth daily.  . verapamil (CALAN-SR) 240 MG CR tablet TAKE 1 TABLET BY MOUTH AT BEDTIME   No current facility-administered medications for this visit.  (Other)      REVIEW OF SYSTEMS:    ALLERGIES Allergies  Allergen Reactions  . Penicillins Hives  . Sulfa Antibiotics Hives    PAST MEDICAL HISTORY Past Medical History:  Diagnosis Date  . Ankle fracture, right 2000  . Breast cancer ([place]) 04/2007   Left  . Cataracts, bilateral   . Hyperlipidemia   . Hypertension   . Hypothyroid   . SVT (supraventricular tachycardia) ([place])   . Syncope    Past Surgical History:  Procedure Laterality Date  . ABDOMINAL HYSTERECTOMY    . APPENDECTOMY    . BREAST CYST EXCISION  1987   again left breast cancer in 2009  . CATARACT EXTRACTION Bilateral 2011  . MASTECTOMY Left 06/2008  . TONSILLECTOMY      FAMILY HISTORY Family History  Problem Relation Age of Onset  . Hypertension Mother   . Heart disease Mother   . Heart disease Father   . Heart attack Father   . Cancer Sister        Pancreatic and liver cancer  . Cancer Brother        Colon cancer  . Cancer Other        Breast cancer    SOCIAL HISTORY Social History   Tobacco Use  . Smoking status: Never Smoker  .  Smokeless tobacco: Never Used  Vaping Use  . Vaping Use: Never used  Substance Use Topics  . Alcohol use: No  . Drug use: No         OPHTHALMIC EXAM: Base Eye Exam    Visual Acuity (ETDRS)      Right Left   Dist cc 20/30 -1 CF at face   Dist ph cc 20/30 +2 NI   Correction: Glasses       Tonometry (Tonopen, 10:04 AM)      Right Left   Pressure 14 11       Pupils      Dark Light Shape React APD   Right 4 3 Round Brisk None   Left 4 3 Round Brisk +1       Visual Fields      Left Right     Full   Restrictions Total superior temporal, inferior temporal, superior nasal, inferior nasal deficiencies        Extraocular Movement      Right Left    Full Full       Neuro/Psych    Oriented x3: Yes   Mood/Affect: Normal       Dilation    Left eye: 1.0% Mydriacyl, 2.5% Phenylephrine @ 10:04 AM        Slit Lamp and Fundus Exam     External Exam      Right Left   External Normal Normal       Slit Lamp Exam      Right Left   Lids/Lashes Normal Normal   Conjunctiva/Sclera White and quiet White and quiet   Cornea Clear Clear   Anterior Chamber Deep and quiet Deep and quiet   Iris Round and reactive Round and reactive   Lens Centered posterior chamber intraocular lens Centered posterior chamber intraocular lens   Anterior Vitreous Normal Normal       Fundus Exam      Right Left   Posterior Vitreous  Posterior vitreous detachment   Disc  Normal   C/D Ratio  0.6   Macula  Macular thickening, Hemorrhage, Cystoid macular edema, Microaneurysms   Vessels  CRVO, ischemic   Periphery  Diffuse retinal hemorrhages          IMAGING AND PROCEDURES  Imaging and Procedures for 01/17/20  OCT, Retina - OU - Both Eyes       Right Eye Quality was good. Scan locations included subfoveal. Central Foveal Thickness: 268. Progression has been stable. Findings include normal foveal contour.   Left Eye Quality was borderline. Progression has improved. Findings include cystoid macular edema, central retinal atrophy, outer retinal atrophy, inner retinal atrophy.   Notes Mild CME inferiorly and temporal to the macula.  Diffuse retinal macular atrophy OS persists.       Intravitreal Injection, Pharmacologic Agent - OS - Left Eye       Time Out 01/17/2020. 11:02 AM. Confirmed correct patient, procedure, site, and patient consented.   Anesthesia Topical anesthesia was used. Anesthetic medications included Akten 3.5%.   Procedure Preparation included Ofloxacin , Tobramycin 0.3%, 10% betadine to eyelids, 5% betadine to ocular surface. A 30 gauge needle was used.   Injection:  2.5 mg Bevacizumab (AVASTIN) 2.5mg /0.74mL SOSY   NDC: M6102387, [id number]   Route: Intravitreal, Site: Left Eye  Post-op Post injection exam found visual acuity of at least counting fingers. The patient tolerated the procedure well. There  were no complications. The patient received written and verbal post  procedure care education. Post injection medications were not given.                 ASSESSMENT/PLAN:  Central retinal vein occlusion with macular edema of left eye Diffuse retinal and macular atrophy persists, currently at 8-week interval post Avastin we will repeat injection today and evaluate in 10 weeks      ICD-10-CM   1. Central retinal vein occlusion with macular edema of left eye  H34.8120 OCT, Retina - OU - Both Eyes    Intravitreal Injection, Pharmacologic Agent - OS - Left Eye    bevacizumab (AVASTIN) SOSY 2.5 mg    1.  CME OS, controlled and stable.  Diffuse macular atrophy suggest retinal nonperfusion.  Thus we will treat to maintain but not to improve acuity.  We will extend interval examination out of 10 weeks next  2.  Dilate OU next for follow-up  3.  Ophthalmic Meds Ordered this visit:  Meds ordered this encounter  Medications  . bevacizumab (AVASTIN) SOSY 2.5 mg       Return in about 10 weeks (around 03/27/2020) for DILATE OU, AVASTIN OCT, OS.  There are no Patient Instructions on file for this visit.   Explained the diagnoses, plan, and follow up with the patient and they expressed understanding.  Patient expressed understanding of the importance of proper follow up care.   Clent Demark Wyndell Cardiff M.D. Diseases & Surgery of the Retina and Vitreous Retina & Diabetic [place] 01/17/20     Abbreviations: M myopia (nearsighted); A astigmatism; H hyperopia (farsighted); P presbyopia; Mrx spectacle prescription;  CTL contact lenses; OD right eye; OS left eye; OU both eyes  XT exotropia; ET esotropia; PEK punctate epithelial keratitis; PEE punctate epithelial erosions; DES dry eye syndrome; MGD meibomian gland dysfunction; ATs artificial tears; PFAT's preservative free artificial tears; [place] nuclear sclerotic cataract; PSC posterior subcapsular cataract; ERM epi-retinal membrane; PVD posterior  vitreous detachment; RD retinal detachment; DM diabetes mellitus; DR diabetic retinopathy; NPDR non-proliferative diabetic retinopathy; PDR proliferative diabetic retinopathy; CSME clinically significant macular edema; DME diabetic macular edema; dbh dot blot hemorrhages; CWS cotton wool spot; POAG primary open angle glaucoma; C/D cup-to-disc ratio; HVF humphrey visual field; GVF goldmann visual field; OCT optical coherence tomography; IOP intraocular pressure; BRVO Branch retinal vein occlusion; CRVO central retinal vein occlusion; CRAO central retinal artery occlusion; BRAO branch retinal artery occlusion; RT retinal tear; SB scleral buckle; PPV pars plana vitrectomy; VH Vitreous hemorrhage; PRP panretinal laser photocoagulation; IVK intravitreal kenalog; VMT vitreomacular traction; MH Macular hole;  NVD neovascularization of the disc; NVE neovascularization elsewhere; AREDS age related eye disease study; ARMD age related macular degeneration; POAG primary open angle glaucoma; EBMD epithelial/anterior basement membrane dystrophy; ACIOL anterior chamber intraocular lens; IOL intraocular lens; PCIOL posterior chamber intraocular lens; Phaco/IOL phacoemulsification with intraocular lens placement; [place] photorefractive keratectomy; LASIK laser assisted in situ keratomileusis; HTN hypertension; DM diabetes mellitus; COPD chronic obstructive pulmonary disease

## 2020-02-07 ENCOUNTER — Ambulatory Visit (INDEPENDENT_AMBULATORY_CARE_PROVIDER_SITE_OTHER): Payer: Medicare Other | Admitting: Family Medicine

## 2020-02-07 ENCOUNTER — Encounter: Payer: Self-pay | Admitting: Family Medicine

## 2020-02-07 ENCOUNTER — Other Ambulatory Visit: Payer: Self-pay

## 2020-02-07 VITALS — BP 123/68 | HR 79 | Ht 64.0 in | Wt 119.0 lb

## 2020-02-07 DIAGNOSIS — E039 Hypothyroidism, unspecified: Secondary | ICD-10-CM | POA: Diagnosis not present

## 2020-02-07 DIAGNOSIS — I10 Essential (primary) hypertension: Secondary | ICD-10-CM | POA: Diagnosis not present

## 2020-02-07 DIAGNOSIS — E782 Mixed hyperlipidemia: Secondary | ICD-10-CM

## 2020-02-07 DIAGNOSIS — I499 Cardiac arrhythmia, unspecified: Secondary | ICD-10-CM | POA: Diagnosis not present

## 2020-02-07 DIAGNOSIS — R350 Frequency of micturition: Secondary | ICD-10-CM

## 2020-02-07 LAB — URINALYSIS, COMPLETE
Bilirubin, UA: NEGATIVE
Glucose, UA: NEGATIVE
Ketones, UA: NEGATIVE
Nitrite, UA: NEGATIVE
RBC, UA: NEGATIVE
Specific Gravity, UA: 1.02 (ref 1.005–1.030)
Urobilinogen, Ur: 0.2 mg/dL (ref 0.2–1.0)
pH, UA: 7 (ref 5.0–7.5)

## 2020-02-07 LAB — MICROSCOPIC EXAMINATION

## 2020-02-07 MED ORDER — VERAPAMIL HCL ER 240 MG PO TBCR
240.0000 mg | EXTENDED_RELEASE_TABLET | Freq: Every day | ORAL | 3 refills | Status: AC
Start: 2020-02-07 — End: ?

## 2020-02-07 MED ORDER — LEVOTHYROXINE SODIUM 88 MCG PO TABS
ORAL_TABLET | ORAL | 3 refills | Status: AC
Start: 2020-02-07 — End: ?

## 2020-02-07 MED ORDER — LOSARTAN POTASSIUM 25 MG PO TABS: ORAL_TABLET | ORAL | 3 refills | Status: DC

## 2020-02-07 MED ORDER — DIGOXIN 250 MCG PO TABS
250.0000 ug | ORAL_TABLET | Freq: Every day | ORAL | 3 refills | Status: AC
Start: 2020-02-07 — End: ?

## 2020-02-07 MED ORDER — ATORVASTATIN CALCIUM 40 MG PO TABS: 40.0000 mg | ORAL_TABLET | Freq: Every day | ORAL | 3 refills | Status: DC

## 2020-02-07 NOTE — Progress Notes (Signed)
 BP 123/68   Pulse 79   Ht 5' 4" (1.626 m)   Wt 119 lb (54 kg)   SpO2 96%   BMI 20.43 kg/m    Subjective:   Patient ID: Erica Conner, female    DOB: 06/10/1926, 87 y.o.   MRN: [id number]  HPI: Erica Conner is a 87 y.o. female presenting on 02/07/2020 for Medical Management of Chronic Issues, Hyperlipidemia, Hypertension, and Hypothyroidism   HPI Hypothyroidism recheck Patient is coming in for thyroid recheck today as well. They deny any issues with hair changes or heat or cold problems or diarrhea or constipation. They deny any chest pain or palpitations. They are currently on levothyroxine 88 micrograms   Hypertension Patient is currently on losartan and verapamil, and their blood pressure today is 123/68. Patient denies any lightheadedness or dizziness. Patient denies headaches, blurred vision, chest pains, shortness of breath, or weakness. Denies any side effects from medication and is content with current medication.   Hyperlipidemia Patient is coming in for recheck of his hyperlipidemia. The patient is currently taking atorvastatin and fish oil. They deny any issues with myalgias or history of liver damage from it. They deny any focal numbness or weakness or chest pain.   Patient has been having some urinary frequency but no burning or pain or confusion.  They would like to check a urine just to make sure.  Relevant past medical, surgical, family and social history reviewed and updated as indicated. Interim medical history since our last visit reviewed. Allergies and medications reviewed and updated.  Review of Systems  Constitutional: Negative for chills and fever.  Eyes: Negative for redness and visual disturbance.  Respiratory: Negative for chest tightness and shortness of breath.   Cardiovascular: Negative for chest pain and leg swelling.  Genitourinary: Positive for frequency and urgency. Negative for difficulty urinating, dysuria, vaginal bleeding, vaginal  discharge and vaginal pain.  Musculoskeletal: Negative for back pain and gait problem.  Skin: Negative for rash.  Neurological: Negative for light-headedness and headaches.  Psychiatric/Behavioral: Negative for agitation and behavioral problems.  All other systems reviewed and are negative.   Per HPI unless specifically indicated above   Allergies as of 02/07/2020      Reactions   Penicillins Hives   Sulfa Antibiotics Hives      Medication List       Accurate as of February 07, 2020  1:13 PM. If you have any questions, ask your nurse or doctor.        aspirin 81 MG tablet Take 81 mg by mouth daily.   atorvastatin 40 MG tablet Commonly known as: LIPITOR Take 1 tablet by mouth once daily   calcium carbonate 200 MG capsule Take 250 mg by mouth daily.   digoxin 0.25 MG tablet Commonly known as: LANOXIN Take 1 tablet by mouth once daily   Euthyrox 88 MCG tablet Generic drug: levothyroxine TAKE 1 TABLET BY MOUTH ONCE DAILY BEFORE BREAKFAST   Eye Drops 0.5 % Soln Apply 1 drop to eye daily.   fish oil-omega-3 fatty acids 1000 MG capsule Take 2 g by mouth 2 (two) times daily.   losartan 25 MG tablet Commonly known as: COZAAR Take 1/2 (one-half) tablet by mouth once daily   multivitamin tablet Take 1 tablet by mouth daily.   verapamil 240 MG CR tablet Commonly known as: CALAN-SR TAKE 1 TABLET BY MOUTH AT BEDTIME   Vitamin D 50 MCG (2000 UT) tablet Take 2,000 Units by mouth daily.          Objective:   BP 123/68   Pulse 79   Ht 5' 4" (1.626 m)   Wt 119 lb (54 kg)   SpO2 96%   BMI 20.43 kg/m   Wt Readings from Last 3 Encounters:  02/07/20 119 lb (54 kg)  06/15/19 117 lb (53.1 kg)  03/30/19 116 lb 12.8 oz (53 kg)    Physical Exam Vitals and nursing note reviewed.  Constitutional:      General: She is not in acute distress.    Appearance: She is well-developed and well-nourished. She is not diaphoretic.  Eyes:     Extraocular Movements: EOM normal.      Conjunctiva/sclera: Conjunctivae normal.  Cardiovascular:     Rate and Rhythm: Normal rate and regular rhythm.     Pulses: Intact distal pulses.     Heart sounds: Normal heart sounds. No murmur heard.   Pulmonary:     Effort: Pulmonary effort is normal. No respiratory distress.     Breath sounds: Normal breath sounds. No wheezing.  Musculoskeletal:        General: No tenderness or edema. Normal range of motion.  Skin:    General: Skin is warm and dry.     Findings: No rash.  Neurological:     Mental Status: She is alert and oriented to person, place, and time.     Coordination: Coordination normal.  Psychiatric:        Mood and Affect: Mood and affect normal.        Behavior: Behavior normal.       Assessment & Plan:   Problem List Items Addressed This Visit      Cardiovascular and Mediastinum   Hypertension - Primary   Relevant Medications   atorvastatin (LIPITOR) 40 MG tablet   digoxin (LANOXIN) 0.25 MG tablet   losartan (COZAAR) 25 MG tablet   verapamil (CALAN-SR) 240 MG CR tablet   Other Relevant Orders   CBC with Differential/Platelet     Endocrine   Hypothyroidism   Relevant Medications   levothyroxine (EUTHYROX) 88 MCG tablet   Other Relevant Orders   CBC with Differential/Platelet   TSH     Other   Hyperlipidemia   Relevant Medications   atorvastatin (LIPITOR) 40 MG tablet   digoxin (LANOXIN) 0.25 MG tablet   losartan (COZAAR) 25 MG tablet   verapamil (CALAN-SR) 240 MG CR tablet   Other Relevant Orders   CBC with Differential/Platelet   Lipid panel   Irregular heart beat   Relevant Medications   digoxin (LANOXIN) 0.25 MG tablet    Other Visit Diagnoses    Essential hypertension       Relevant Medications   atorvastatin (LIPITOR) 40 MG tablet   digoxin (LANOXIN) 0.25 MG tablet   losartan (COZAAR) 25 MG tablet   verapamil (CALAN-SR) 240 MG CR tablet   Other Relevant Orders   CMP14+EGFR   Frequency of urination       Relevant Orders    Urine Culture   Urinalysis, Complete      Continue current medication, will check urine.  Blood pressure looks good today, continue to monitor at home. Follow up plan: Return in about 6 months (around 08/06/2020), or if symptoms worsen or fail to improve.  Counseling provided for all of the vaccine components No orders of the defined types were placed in this encounter.    , MD [facility] 02/07/2020, 1:13 PM     

## 2020-02-08 LAB — CMP14+EGFR
ALT: 19 IU/L (ref 0–32)
AST: 24 IU/L (ref 0–40)
Albumin/Globulin Ratio: 1.6 (ref 1.2–2.2)
Albumin: 4.1 g/dL (ref 3.5–4.6)
Alkaline Phosphatase: 58 IU/L (ref 44–121)
BUN/Creatinine Ratio: 20 (ref 12–28)
BUN: 14 mg/dL (ref 10–36)
Bilirubin Total: 0.3 mg/dL (ref 0.0–1.2)
CO2: 25 mmol/L (ref 20–29)
Calcium: 9.9 mg/dL (ref 8.7–10.3)
Chloride: 101 mmol/L (ref 96–106)
Creatinine, Ser: 0.69 mg/dL (ref 0.57–1.00)
GFR calc Af Amer: 88 mL/min/{1.73_m2} (ref >59)
GFR calc non Af Amer: 76 mL/min/{1.73_m2} (ref >59)
Globulin, Total: 2.6 g/dL (ref 1.5–4.5)
Glucose: 95 mg/dL (ref 65–99)
Potassium: 3.9 mmol/L (ref 3.5–5.2)
Sodium: 138 mmol/L (ref 134–144)
Total Protein: 6.7 g/dL (ref 6.0–8.5)

## 2020-02-08 LAB — CBC WITH DIFFERENTIAL/PLATELET
Basophils Absolute: 0 10*3/uL (ref 0.0–0.2)
Basos: 0 %
EOS (ABSOLUTE): 0.1 10*3/uL (ref 0.0–0.4)
Eos: 2 %
Hematocrit: 41.5 % (ref 34.0–46.6)
Hemoglobin: 13.7 g/dL (ref 11.1–15.9)
Immature Grans (Abs): 0 10*3/uL (ref 0.0–0.1)
Immature Granulocytes: 0 %
Lymphocytes Absolute: 1.9 10*3/uL (ref 0.7–3.1)
Lymphs: 21 %
MCH: 30.4 pg (ref 26.6–33.0)
MCHC: 33 g/dL (ref 31.5–35.7)
MCV: 92 fL (ref 79–97)
Monocytes Absolute: 1 10*3/uL — ABNORMAL HIGH (ref 0.1–0.9)
Monocytes: 11 %
Neutrophils Absolute: 6.1 10*3/uL (ref 1.4–7.0)
Neutrophils: 66 %
Platelets: 296 10*3/uL (ref 150–450)
RBC: 4.5 x10E6/uL (ref 3.77–5.28)
RDW: 12.3 % (ref 11.7–15.4)
WBC: 9.2 10*3/uL (ref 3.4–10.8)

## 2020-02-08 LAB — LIPID PANEL
Chol/HDL Ratio: 2.4 ratio (ref 0.0–4.4)
Cholesterol, Total: 107 mg/dL (ref 100–199)
HDL: 44 mg/dL (ref >39)
LDL Chol Calc (NIH): 38 mg/dL (ref 0–99)
Triglycerides: 149 mg/dL (ref 0–149)
VLDL Cholesterol Cal: 25 mg/dL (ref 5–40)

## 2020-02-08 LAB — TSH: TSH: 1.66 u[IU]/mL (ref 0.450–4.500)

## 2020-02-10 ENCOUNTER — Other Ambulatory Visit: Payer: Self-pay | Admitting: Family Medicine

## 2020-02-10 LAB — URINE CULTURE

## 2020-02-10 MED ORDER — CEPHALEXIN 500 MG PO CAPS: 500.0000 mg | ORAL_CAPSULE | Freq: Four times a day (QID) | ORAL | 0 refills | Status: DC

## 2020-02-10 NOTE — Progress Notes (Signed)
Patient's culture came back positive, sent Keflex.

## 2020-03-27 ENCOUNTER — Ambulatory Visit (INDEPENDENT_AMBULATORY_CARE_PROVIDER_SITE_OTHER): Payer: Medicare Other | Admitting: Ophthalmology

## 2020-03-27 ENCOUNTER — Encounter (INDEPENDENT_AMBULATORY_CARE_PROVIDER_SITE_OTHER): Payer: Medicare Other | Admitting: Ophthalmology

## 2020-03-27 ENCOUNTER — Encounter (INDEPENDENT_AMBULATORY_CARE_PROVIDER_SITE_OTHER): Payer: Self-pay | Admitting: Ophthalmology

## 2020-03-27 ENCOUNTER — Other Ambulatory Visit: Payer: Self-pay

## 2020-03-27 DIAGNOSIS — H34812 Central retinal vein occlusion, left eye, with macular edema: Secondary | ICD-10-CM

## 2020-03-27 DIAGNOSIS — H43811 Vitreous degeneration, right eye: Secondary | ICD-10-CM | POA: Insufficient documentation

## 2020-03-27 DIAGNOSIS — H43812 Vitreous degeneration, left eye: Secondary | ICD-10-CM | POA: Diagnosis not present

## 2020-03-27 MED ORDER — BEVACIZUMAB 2.5 MG/0.1ML IZ SOSY
2.5000 mg | PREFILLED_SYRINGE | INTRAVITREAL | Status: AC | PRN
  Administered 2020-03-27: 2.5 mg via INTRAVITREAL

## 2020-03-27 NOTE — Assessment & Plan Note (Signed)

## 2020-03-27 NOTE — Progress Notes (Signed)
03/27/2020     CHIEF COMPLAINT Patient presents for Retina Follow Up (10 Week CRVO f\u. Possible Avastin OS. OCT/Pt states vision is about the same. Pt states she does not see well.)   HISTORY OF PRESENT ILLNESS: Erica Conner is a 87 y.o. female who presents to the clinic today for:   HPI    Retina Follow Up    Patient presents with  CRVO/BRVO.  In left eye.  Severity is moderate.  Duration of 10 weeks.  Since onset it is stable. Additional comments: 10 Week CRVO f\u. Possible Avastin OS. OCT Pt states vision is about the same. Pt states she does not see well.       Last edited by Tilda Franco on 03/27/2020  9:01 AM. (History)      Referring physician: Dettinger, Fransisca Kaufmann, MD [place],  [place] [address]  HISTORICAL INFORMATION:   Selected notes from the MEDICAL RECORD NUMBER       CURRENT MEDICATIONS: Current Outpatient Medications (Ophthalmic Drugs)  Medication Sig   Carboxymethylcellulose Sodium (EYE DROPS) 0.5 % SOLN Apply 1 drop to eye daily.   No current facility-administered medications for this visit. (Ophthalmic Drugs)   Current Outpatient Medications (Other)  Medication Sig   aspirin 81 MG tablet Take 81 mg by mouth daily.   atorvastatin (LIPITOR) 40 MG tablet Take 1 tablet (40 mg total) by mouth daily.   calcium carbonate 200 MG capsule Take 250 mg by mouth daily.   cephALEXin (KEFLEX) 500 MG capsule Take 1 capsule (500 mg total) by mouth 4 (four) times daily.   Cholecalciferol (VITAMIN D) 2000 UNITS tablet Take 2,000 Units by mouth daily.   digoxin (LANOXIN) 0.25 MG tablet Take 1 tablet (250 mcg total) by mouth daily.   fish oil-omega-3 fatty acids 1000 MG capsule Take 2 g by mouth 2 (two) times daily.   levothyroxine (EUTHYROX) 88 MCG tablet TAKE 1 TABLET BY MOUTH ONCE DAILY BEFORE BREAKFAST   losartan (COZAAR) 25 MG tablet Take 1/2 (one-half) tablet by mouth once daily   Multiple Vitamin (MULTIVITAMIN) tablet Take 1 tablet by  mouth daily.   verapamil (CALAN-SR) 240 MG CR tablet Take 1 tablet (240 mg total) by mouth at bedtime.   No current facility-administered medications for this visit. (Other)      REVIEW OF SYSTEMS:    ALLERGIES Allergies  Allergen Reactions   Penicillins Hives   Sulfa Antibiotics Hives    PAST MEDICAL HISTORY Past Medical History:  Diagnosis Date   Ankle fracture, right 2000   Breast cancer ([place]) 04/2007   Left   Cataracts, bilateral    Hyperlipidemia    Hypertension    Hypothyroid    SVT (supraventricular tachycardia) ([place])    Syncope    Past Surgical History:  Procedure Laterality Date   ABDOMINAL HYSTERECTOMY     APPENDECTOMY     BREAST CYST EXCISION  1987   again left breast cancer in 2009   CATARACT EXTRACTION Bilateral 2011   MASTECTOMY Left 06/2008   TONSILLECTOMY      FAMILY HISTORY Family History  Problem Relation Age of Onset   Hypertension Mother    Heart disease Mother    Heart disease Father    Heart attack Father    Cancer Sister        Pancreatic and liver cancer   Cancer Brother        Colon cancer   Cancer Other  Breast cancer    SOCIAL HISTORY Social History   Tobacco Use   Smoking status: Never Smoker   Smokeless tobacco: Never Used  Vaping Use   Vaping Use: Never used  Substance Use Topics   Alcohol use: No   Drug use: No         OPHTHALMIC EXAM:  Base Eye Exam    Visual Acuity (Snellen - Linear)      Right Left   Dist cc 20/30 -2 CF @ 2'    Dist ph cc  NI   Correction: Glasses       Tonometry (Tonopen, 9:07 AM)      Right Left   Pressure 14 14       Pupils      Pupils Dark Light Shape React APD   Right PERRL 4 3 Round Brisk None   Left PERRL 4 3 Round Brisk +1       Visual Fields (Counting fingers)      Left Right     Full   Restrictions Partial outer superior temporal, inferior temporal, superior nasal, inferior nasal deficiencies        Neuro/Psych    Oriented  x3: Yes   Mood/Affect: Normal       Dilation    Both eyes: 1.0% Mydriacyl, 2.5% Phenylephrine @ 9:07 AM        Slit Lamp and Fundus Exam    External Exam      Right Left   External Normal Normal       Slit Lamp Exam      Right Left   Lids/Lashes Normal Normal   Conjunctiva/Sclera White and quiet White and quiet   Cornea Clear Clear   Anterior Chamber Deep and quiet Deep and quiet   Iris Round and reactive Round and reactive, no  NVI   Lens Centered posterior chamber intraocular lens Centered posterior chamber intraocular lens   Anterior Vitreous Normal Normal       Fundus Exam      Right Left   Posterior Vitreous Posterior vitreous detachment Posterior vitreous detachment   Disc Normal Normal   C/D Ratio 0.5 0.6   Macula Normal Hemorrhage, Microaneurysms, no macular thickening, no cystoid macular edema   Vessels Normal CRVO, ischemic, no N/V   Periphery Normal Diffuse retinal hemorrhages          IMAGING AND PROCEDURES  Imaging and Procedures for 03/27/20  OCT, Retina - OU - Both Eyes       Right Eye Quality was good. Scan locations included subfoveal. Central Foveal Thickness: 270. Progression has been stable. Findings include normal foveal contour.   Left Eye Quality was good. Central Foveal Thickness: 235. Progression has improved. Findings include cystoid macular edema, central retinal atrophy, outer retinal atrophy, inner retinal atrophy.   Notes   Diffuse retinal macular atrophy OS persists.       Intravitreal Injection, Pharmacologic Agent - OS - Left Eye       Time Out 03/27/2020. 10:03 AM. Confirmed correct patient, procedure, site, and patient consented.   Anesthesia Topical anesthesia was used. Anesthetic medications included [place] 3.5%.   Procedure Preparation included Ofloxacin , Tobramycin 0.3%, 10% betadine to eyelids, 5% betadine to ocular surface. A 30 gauge needle was used.   Injection:  2.5 mg Bevacizumab (AVASTIN) 2.5mg /0.23mL  SOSY   NDC: 67124-580-99, Lot: [id number]   Route: Intravitreal, Site: Left Eye  Post-op Post injection exam found visual acuity of at least counting fingers. The  patient tolerated the procedure well. There were no complications. The patient received written and verbal post procedure care education. Post injection medications were not given.                 ASSESSMENT/PLAN:  Central retinal vein occlusion with macular edema of left eye As compared to onset of therapy, August 2021, much less diffuse retinal thickening from CME of CRV O.  Now residual diffuse atrophy exists accounting for the acuity.  Currently at 10-week follow-up post Avastin.  We will repeat today and examination again in 12 weeks  Posterior vitreous detachment of left eye   The nature of posterior vitreous detachment was discussed with the patient as well as its physiology, its age prevalence, and its possible implication regarding retinal breaks and detachment.  An informational brochure was given to the patient.  All the patient's questions were answered.  The patient was asked to return if new or different flashes or floaters develops.   Patient was instructed to contact office immediately if any changes were noticed. I explained to the patient that vitreous inside the eye is similar to jello inside a bowl. As the jello melts it can start to pull away from the bowl, similarly the vitreous throughout our lives can begin to pull away from the retina. That process is called a posterior vitreous detachment. In some cases, the vitreous can tug hard enough on the retina to form a retinal tear. I discussed with the patient the signs and symptoms of a retinal detachment.  Do not rub the eye.      ICD-10-CM   1. Central retinal vein occlusion with macular edema of left eye  H34.8120 OCT, Retina - OU - Both Eyes    Intravitreal Injection, Pharmacologic Agent - OS - Left Eye    bevacizumab (AVASTIN) SOSY 2.5 mg  2. Posterior  vitreous detachment of left eye  H43.812   3. Posterior vitreous detachment of right eye  H43.811     1.  OS looks great with improved macular edema and stabilization of ischemic disease from CRV O OS.  At 10-week follow-up interval post Avastin today.  We will repeat injection Avastin OS today  2.  Dilated examination left eye next in in 3 months  3.  Ophthalmic Meds Ordered this visit:  Meds ordered this encounter  Medications   bevacizumab (AVASTIN) SOSY 2.5 mg       Return in about 3 months (around 06/27/2020) for dilate, OS, AVASTIN OCT.  There are no Patient Instructions on file for this visit.   Explained the diagnoses, plan, and follow up with the patient and they expressed understanding.  Patient expressed understanding of the importance of proper follow up care.   Clent Demark Stasia Somero M.D. Diseases & Surgery of the Retina and Vitreous [facility]ina [facility] Diabetic [place] 03/27/20     Abbreviations: M myopia (nearsighted); A astigmatism; H hyperopia (farsighted); P presbyopia; Mrx spectacle prescription;  CTL contact lenses; OD right eye; OS left eye; OU both eyes  XT exotropia; ET esotropia; PEK punctate epithelial keratitis; PEE punctate epithelial erosions; DES dry eye syndrome; MGD meibomian gland dysfunction; ATs artificial tears; PFAT's preservative free artificial tears; [place] nuclear sclerotic cataract; PSC posterior subcapsular cataract; ERM epi-retinal membrane; PVD posterior vitreous detachment; RD retinal detachment; DM diabetes mellitus; DR diabetic retinopathy; NPDR non-proliferative diabetic retinopathy; PDR proliferative diabetic retinopathy; CSME clinically significant macular edema; DME diabetic macular edema; dbh dot blot hemorrhages; CWS cotton wool spot; POAG primary open  angle glaucoma; C/D cup-to-disc ratio; HVF humphrey visual field; GVF goldmann visual field; OCT optical coherence tomography; IOP intraocular pressure; BRVO Branch retinal vein occlusion; CRVO  central retinal vein occlusion; CRAO central retinal artery occlusion; BRAO branch retinal artery occlusion; RT retinal tear; SB scleral buckle; PPV pars plana vitrectomy; VH Vitreous hemorrhage; PRP panretinal laser photocoagulation; IVK intravitreal kenalog; VMT vitreomacular traction; MH Macular hole;  NVD neovascularization of the disc; NVE neovascularization elsewhere; AREDS age related eye disease study; ARMD age related macular degeneration; POAG primary open angle glaucoma; EBMD epithelial/anterior basement membrane dystrophy; ACIOL anterior chamber intraocular lens; IOL intraocular lens; PCIOL posterior chamber intraocular lens; Phaco/IOL phacoemulsification with intraocular lens placement; [place] photorefractive keratectomy; LASIK laser assisted in situ keratomileusis; HTN hypertension; DM diabetes mellitus; COPD chronic obstructive pulmonary disease

## 2020-03-27 NOTE — Assessment & Plan Note (Signed)
As compared to onset of therapy, August 2021, much less diffuse retinal thickening from CME of CRV O.  Now residual diffuse atrophy exists accounting for the acuity.  Currently at 10-week follow-up post Avastin.  We will repeat today and examination again in 12 weeks

## 2020-04-23 ENCOUNTER — Telehealth: Payer: Self-pay

## 2020-04-23 NOTE — Telephone Encounter (Signed)
Patient aware and verbalized understanding. °

## 2020-04-23 NOTE — Telephone Encounter (Signed)
Problems with constipation in past  Scan showed large amts of stools at that time  Metamucil helpful  January was fine.  Stomach is large again. Not moving around a whole lot. Does drink water. About 3-4 cups per day.  1-2 tablespoons per day of Metamucil.  Not having a complete BM per daughter. Concerned about blockage. ( Father had)  States that she is not able to pee enough.  Denies pain abdominal pain or pain with urination  Meals on wheels daily and feels she is eating well.  Denies nausea and vomiting. Just some uncomfortable and says she is tired.  Not disoriented or confused.  Says she is some SOB  Ensure three times weekly  Is there something that they can add with the Metamucil. Does she need to come in and leave a urine sample?

## 2020-04-23 NOTE — Telephone Encounter (Signed)
To fully assess for blockage I would have to see her in person but based on not having pain I do not think she has a full blockage.  If she is passing gas as well that is a good sign that she does not have a full blockage.  If you want to hit it hard in trying clean everything out I would have her do MiraLAX twice a day drink lots of water and have her do a fleets enema.  If still not working and wants to be seen then please let us know and we can schedule her to be evaluated in person.

## 2020-05-03 ENCOUNTER — Other Ambulatory Visit: Payer: Self-pay | Admitting: Family Medicine

## 2020-05-03 DIAGNOSIS — E782 Mixed hyperlipidemia: Secondary | ICD-10-CM

## 2020-05-03 DIAGNOSIS — I10 Essential (primary) hypertension: Secondary | ICD-10-CM

## 2020-06-28 ENCOUNTER — Encounter (INDEPENDENT_AMBULATORY_CARE_PROVIDER_SITE_OTHER): Payer: Medicare Other | Admitting: Ophthalmology

## 2020-07-03 ENCOUNTER — Encounter (INDEPENDENT_AMBULATORY_CARE_PROVIDER_SITE_OTHER): Payer: Self-pay | Admitting: Ophthalmology

## 2020-07-03 ENCOUNTER — Other Ambulatory Visit: Payer: Self-pay

## 2020-07-03 ENCOUNTER — Ambulatory Visit (INDEPENDENT_AMBULATORY_CARE_PROVIDER_SITE_OTHER): Payer: Medicare Other | Admitting: Ophthalmology

## 2020-07-03 DIAGNOSIS — H34812 Central retinal vein occlusion, left eye, with macular edema: Secondary | ICD-10-CM | POA: Diagnosis not present

## 2020-07-03 DIAGNOSIS — H3412 Central retinal artery occlusion, left eye: Secondary | ICD-10-CM | POA: Diagnosis not present

## 2020-07-03 MED ORDER — BEVACIZUMAB 2.5 MG/0.1ML IZ SOSY
2.5000 mg | PREFILLED_SYRINGE | INTRAVITREAL | Status: AC | PRN
  Administered 2020-07-03: 2.5 mg via INTRAVITREAL

## 2020-07-03 NOTE — Assessment & Plan Note (Signed)
CME apparently resolved, now at 33-month follow-up OS likely on the basis of concomitant attenuated retinal artery and thus proximal arterial attenuation leading to retinal nonperfusion

## 2020-07-03 NOTE — Progress Notes (Signed)
07/03/2020     CHIEF COMPLAINT Patient presents for Retina Follow Up (3 Mo F/U OS, poss Avastin OS//Pt sts, "I can't see as good, that's it." Pt denies any other new symptoms OU. )   HISTORY OF PRESENT ILLNESS: Erica Conner is a 87 y.o. female who presents to the clinic today for:   HPI     Retina Follow Up           Diagnosis: CRVO/BRVO   Laterality: left eye   Onset: 3 months ago   Severity: severe   Duration: 3 months   Course: stable   Comments: 3 Mo F/U OS, poss Avastin OS  Pt sts, "I can't see as good, that's it." Pt denies any other new symptoms OU.        Last edited by Milly Jakob, [place] on 07/03/2020  9:52 AM.      Referring physician: Dettinger, Fransisca Kaufmann, MD [place],   [id number]  HISTORICAL INFORMATION:   Selected notes from the MEDICAL RECORD NUMBER       CURRENT MEDICATIONS: Current Outpatient Medications (Ophthalmic Drugs)  Medication Sig   Carboxymethylcellulose Sodium (EYE DROPS) 0.5 % SOLN Apply 1 drop to eye daily.   No current facility-administered medications for this visit. (Ophthalmic Drugs)   Current Outpatient Medications (Other)  Medication Sig   aspirin 81 MG tablet Take 81 mg by mouth daily.   atorvastatin (LIPITOR) 40 MG tablet Take 1 tablet by mouth once daily   calcium carbonate 200 MG capsule Take 250 mg by mouth daily.   cephALEXin (KEFLEX) 500 MG capsule Take 1 capsule (500 mg total) by mouth 4 (four) times daily.   Cholecalciferol (VITAMIN D) 2000 UNITS tablet Take 2,000 Units by mouth daily.   digoxin (LANOXIN) 0.25 MG tablet Take 1 tablet (250 mcg total) by mouth daily.   fish oil-omega-3 fatty acids 1000 MG capsule Take 2 g by mouth 2 (two) times daily.   levothyroxine (EUTHYROX) 88 MCG tablet TAKE 1 TABLET BY MOUTH ONCE DAILY BEFORE BREAKFAST   losartan (COZAAR) 25 MG tablet Take 1/2 (one-half) tablet by mouth once daily   Multiple Vitamin (MULTIVITAMIN) tablet Take 1 tablet by mouth daily.    verapamil (CALAN-SR) 240 MG CR tablet Take 1 tablet (240 mg total) by mouth at bedtime.   No current facility-administered medications for this visit. (Other)      REVIEW OF SYSTEMS:    ALLERGIES Allergies  Allergen Reactions   Penicillins Hives   Sulfa Antibiotics Hives    PAST MEDICAL HISTORY Past Medical History:  Diagnosis Date   Ankle fracture, right 2000   Breast cancer ([place]) 04/2007   Left   Cataracts, bilateral    Hyperlipidemia    Hypertension    Hypothyroid    SVT (supraventricular tachycardia) ([place])    Syncope    Past Surgical History:  Procedure Laterality Date   ABDOMINAL HYSTERECTOMY     APPENDECTOMY     BREAST CYST EXCISION  1987   again left breast cancer in 2009   CATARACT EXTRACTION Bilateral 2011   MASTECTOMY Left 06/2008   TONSILLECTOMY      FAMILY HISTORY Family History  Problem Relation Age of Onset   Hypertension Mother    Heart disease Mother    Heart disease Father    Heart attack Father    Cancer Sister        Pancreatic and liver cancer   Cancer Brother  Colon cancer   Cancer Other        Breast cancer    SOCIAL HISTORY Social History   Tobacco Use   Smoking status: Never   Smokeless tobacco: Never  Vaping Use   Vaping Use: Never used  Substance Use Topics   Alcohol use: No   Drug use: No         OPHTHALMIC EXAM:  Base Eye Exam     Visual Acuity (ETDRS)       Right Left   Dist cc 20/50 -2 LP   Dist ph cc 20/40 NI    Correction: Glasses         Tonometry (Tonopen, 9:55 AM)       Right Left   Pressure 09 12         Pupils       Dark Light Shape React APD   Right 4 3 Round Slow None   Left 4 3 Round Slow Trace         Visual Fields (Counting fingers)       Left Right     Full   Restrictions Total superior temporal, inferior temporal, superior nasal, inferior nasal deficiencies          Extraocular Movement       Right Left    Full Full         Neuro/Psych      Oriented x3: Yes   Mood/Affect: Normal         Dilation     Left eye: 1.0% Mydriacyl, 2.5% Phenylephrine @ 9:55 AM           Slit Lamp and Fundus Exam     External Exam       Right Left   External Normal Normal         Slit Lamp Exam       Right Left   Lids/Lashes Normal Normal   Conjunctiva/Sclera White and quiet White and quiet   Cornea Clear Clear   Anterior Chamber Deep and quiet Deep and quiet   Iris Round and reactive Round and reactive, no  NVI   Lens Centered posterior chamber intraocular lens Centered posterior chamber intraocular lens   Anterior Vitreous Normal Normal         Fundus Exam       Right Left   Posterior Vitreous  Posterior vitreous detachment   Disc  1+ Optic disc atrophy, 2+ Pallor   C/D Ratio  0.6   Macula   Microaneurysms, no macular thickening, no cystoid macular edema   Vessels  CRVO, ischemic, no N/V, diffuse attenuated artery   Periphery  Diffuse retinal hemorrhages            IMAGING AND PROCEDURES  Imaging and Procedures for 07/03/20  OCT, Retina - OU - Both Eyes       Right Eye Quality was good. Scan locations included subfoveal. Central Foveal Thickness: 264. Progression has been stable. Findings include normal foveal contour.   Left Eye Quality was borderline. Progression has been stable. Findings include central retinal atrophy, outer retinal atrophy, inner retinal atrophy.   Notes   Diffuse retinal macular atrophy OS persists.,  No signs of retinal macular edema      Intravitreal Injection, Pharmacologic Agent - OS - Left Eye       Time Out 07/03/2020. 10:27 AM. Confirmed correct patient, procedure, site, and patient consented.   Anesthesia Topical anesthesia was used. Anesthetic medications included [place] 3.5%.  Procedure Preparation included Ofloxacin , Tobramycin 0.3%, 10% betadine to eyelids, 5% betadine to ocular surface. A 30 gauge needle was used.   Injection: 2.5 mg bevacizumab 2.5  MG/0.1ML   Route: Intravitreal, Site: Left Eye   NDC: [phone], Lot: [id number]   Post-op Post injection exam found visual acuity of at least counting fingers. The patient tolerated the procedure well. There were no complications. The patient received written and verbal post procedure care education. Post injection medications were not given.              ASSESSMENT/PLAN:  Central retinal vein occlusion with macular edema of left eye CME apparently resolved, now at 36-month follow-up OS likely on the basis of concomitant attenuated retinal artery and thus proximal arterial attenuation leading to retinal nonperfusion  Retinal artery occlusion, central, left Observe and extend interval therapy     ICD-10-CM   1. Central retinal vein occlusion with macular edema of left eye  H34.8120 OCT, Retina - OU - Both Eyes    Intravitreal Injection, Pharmacologic Agent - OS - Left Eye    bevacizumab (AVASTIN) SOSY 2.5 mg    2. Retinal artery occlusion, central, left  H34.12       1.  Repeat intravitreal Avastin OS today to prevent neovascularization complication as well as resolved CME.  2.   May 1-Day deliver peripheral PRP left eye simply to prevent neovascularization and decrease treatment burden  3.  Ophthalmic Meds Ordered this visit:  Meds ordered this encounter  Medications   bevacizumab (AVASTIN) SOSY 2.5 mg       Return in about 14 weeks (around 10/09/2020) for DILATE OU, AVASTIN OCT, OS.  There are no Patient Instructions on file for this visit.   Explained the diagnoses, plan, and follow up with the patient and they expressed understanding.  Patient expressed understanding of the importance of proper follow up care.   Clent Demark Nasier Thumm M.D. Diseases & Surgery of the Retina and Vitreous [facility]ina [facility] Di[facility] [place] 07/03/20     Abbreviations: M myopia (nearsighted); A astigmatism; H hyperopia (farsighted); P presbyopia; Mrx spectacle prescription;  CTL contact  lenses; OD right eye; OS left eye; OU both eyes  XT exotropia; ET esotropia; PEK punctate epithelial keratitis; PEE punctate epithelial erosions; DES dry eye syndrome; MGD meibomian gland dysfunction; ATs artificial tears; PFAT's preservative free artificial tears; [place] nuclear sclerotic cataract; PSC posterior subcapsular cataract; ERM epi-retinal membrane; PVD posterior vitreous detachment; RD retinal detachment; DM diabetes mellitus; DR diabetic retinopathy; NPDR non-proliferative diabetic retinopathy; PDR proliferative diabetic retinopathy; CSME clinically significant macular edema; DME diabetic macular edema; dbh dot blot hemorrhages; CWS cotton wool spot; POAG primary open angle glaucoma; C/D cup-to-disc ratio; HVF humphrey visual field; GVF goldmann visual field; OCT optical coherence tomography; IOP intraocular pressure; BRVO Branch retinal vein occlusion; CRVO central retinal vein occlusion; CRAO central retinal artery occlusion; BRAO branch retinal artery occlusion; RT retinal tear; SB scleral buckle; PPV pars plana vitrectomy; VH Vitreous hemorrhage; PRP panretinal laser photocoagulation; IVK intravitreal kenalog; VMT vitreomacular traction; MH Macular hole;  NVD neovascularization of the disc; NVE neovascularization elsewhere; AREDS age related eye disease study; ARMD age related macular degeneration; POAG primary open angle glaucoma; EBMD epithelial/anterior basement membrane dystrophy; ACIOL anterior chamber intraocular lens; IOL intraocular lens; PCIOL posterior chamber intraocular lens; Phaco/IOL phacoemulsification with intraocular lens placement; [place] photorefractive keratectomy; LASIK laser assisted in situ keratomileusis; HTN hypertension; DM diabetes mellitus; COPD chronic obstructive pulmonary disease

## 2020-07-03 NOTE — Assessment & Plan Note (Signed)
Observe and extend interval therapy

## 2020-07-04 ENCOUNTER — Telehealth: Payer: Self-pay | Admitting: Family Medicine

## 2020-07-04 NOTE — Telephone Encounter (Signed)
Pt's daughter calling back because she had a missed call. Could not find documentation of why she received a call. Daughter asked that a message be put in to make sure there was nothing Dr Dettinger needed to talk to her about

## 2020-07-19 ENCOUNTER — Emergency Department (HOSPITAL_COMMUNITY)
Admission: EM | Admit: 2020-07-19 | Discharge: 2020-07-19 | Disposition: A | Discharge status: Home / Self Care | Payer: Medicare Other | Attending: Emergency Medicine | Admitting: Emergency Medicine

## 2020-07-19 ENCOUNTER — Emergency Department (HOSPITAL_COMMUNITY): Payer: Medicare Other

## 2020-07-19 ENCOUNTER — Other Ambulatory Visit: Payer: Self-pay

## 2020-07-19 ENCOUNTER — Encounter (HOSPITAL_COMMUNITY): Payer: Self-pay

## 2020-07-19 DIAGNOSIS — Z7982 Long term (current) use of aspirin: Secondary | ICD-10-CM | POA: Insufficient documentation

## 2020-07-19 DIAGNOSIS — K5641 Fecal impaction: Secondary | ICD-10-CM | POA: Insufficient documentation

## 2020-07-19 DIAGNOSIS — R000 Tachycardia, unspecified: Secondary | ICD-10-CM | POA: Insufficient documentation

## 2020-07-19 DIAGNOSIS — R531 Weakness: Secondary | ICD-10-CM

## 2020-07-19 DIAGNOSIS — R410 Disorientation, unspecified: Secondary | ICD-10-CM | POA: Diagnosis not present

## 2020-07-19 DIAGNOSIS — I2510 Atherosclerotic heart disease of native coronary artery without angina pectoris: Secondary | ICD-10-CM | POA: Diagnosis not present

## 2020-07-19 DIAGNOSIS — I10 Essential (primary) hypertension: Secondary | ICD-10-CM | POA: Insufficient documentation

## 2020-07-19 DIAGNOSIS — E039 Hypothyroidism, unspecified: Secondary | ICD-10-CM | POA: Insufficient documentation

## 2020-07-19 DIAGNOSIS — Z79899 Other long term (current) drug therapy: Secondary | ICD-10-CM | POA: Diagnosis not present

## 2020-07-19 DIAGNOSIS — Z853 Personal history of malignant neoplasm of breast: Secondary | ICD-10-CM | POA: Insufficient documentation

## 2020-07-19 LAB — URINALYSIS, ROUTINE W REFLEX MICROSCOPIC
Bilirubin Urine: NEGATIVE
Glucose, UA: NEGATIVE mg/dL
Ketones, ur: NEGATIVE mg/dL
Leukocytes,Ua: NEGATIVE
Nitrite: NEGATIVE
Protein, ur: 100 mg/dL — AB
Specific Gravity, Urine: 1.02 (ref 1.005–1.030)
pH: 8 (ref 5.0–8.0)

## 2020-07-19 LAB — CBC WITH DIFFERENTIAL/PLATELET
Abs Immature Granulocytes: 0.05 10*3/uL (ref 0.00–0.07)
Basophils Absolute: 0 10*3/uL (ref 0.0–0.1)
Basophils Relative: 0 %
Eosinophils Absolute: 0.1 10*3/uL (ref 0.0–0.5)
Eosinophils Relative: 1 %
HCT: 38.5 % (ref 36.0–46.0)
Hemoglobin: 12.9 g/dL (ref 12.0–15.0)
Immature Granulocytes: 0 %
Lymphocytes Relative: 11 %
Lymphs Abs: 1.4 10*3/uL (ref 0.7–4.0)
MCH: 28.3 pg (ref 26.0–34.0)
MCHC: 33.5 g/dL (ref 30.0–36.0)
MCV: 84.4 fL (ref 80.0–100.0)
Monocytes Absolute: 1.3 10*3/uL — ABNORMAL HIGH (ref 0.1–1.0)
Monocytes Relative: 11 %
Neutro Abs: 9.3 10*3/uL — ABNORMAL HIGH (ref 1.7–7.7)
Neutrophils Relative %: 77 %
Platelets: 475 10*3/uL — ABNORMAL HIGH (ref 150–400)
RBC: 4.56 MIL/uL (ref 3.87–5.11)
RDW: 14.1 % (ref 11.5–15.5)
WBC: 12.1 10*3/uL — ABNORMAL HIGH (ref 4.0–10.5)
nRBC: 0 % (ref 0.0–0.2)

## 2020-07-19 LAB — BASIC METABOLIC PANEL
Anion gap: 10 (ref 5–15)
BUN: 9 mg/dL (ref 8–23)
CO2: 22 mmol/L (ref 22–32)
Calcium: 8.3 mg/dL — ABNORMAL LOW (ref 8.9–10.3)
Chloride: 101 mmol/L (ref 98–111)
Creatinine, Ser: 0.45 mg/dL (ref 0.44–1.00)
GFR, Estimated: >60 mL/min (ref >60)
Glucose, Bld: 99 mg/dL (ref 70–99)
Potassium: 3.2 mmol/L — ABNORMAL LOW (ref 3.5–5.1)
Sodium: 133 mmol/L — ABNORMAL LOW (ref 135–145)

## 2020-07-19 LAB — URINALYSIS, MICROSCOPIC (REFLEX)
Squamous Epithelial / HPF: NONE SEEN (ref 0–5)
WBC, UA: NONE SEEN WBC/hpf (ref 0–5)

## 2020-07-19 MED ORDER — POTASSIUM CHLORIDE CRYS ER 20 MEQ PO TBCR
20.0000 meq | EXTENDED_RELEASE_TABLET | Freq: Once | ORAL | Status: AC
  Administered 2020-07-19: 20 meq via ORAL
  Filled 2020-07-19: qty 1

## 2020-07-19 MED ORDER — IOHEXOL 300 MG/ML  SOLN
100.0000 mL | Freq: Once | INTRAMUSCULAR | Status: AC | PRN
  Administered 2020-07-19: 100 mL via INTRAVENOUS

## 2020-07-19 NOTE — ED Provider Notes (Signed)
[facility] EMERGENCY DEPARTMENT Provider Note   CSN: [id number] Arrival date & time: 07/19/20  1021     History Chief Complaint  Patient presents with   Erica Conner is a 87 y.o. female.  HPI  87 year old female with past medical history of HTN, HLD, syncope presents the emergency department after being found in the hallway of her home.  Family at bedside states the patient has been very weak and intermittently confused for the past week.  When they could get a hold of her by phone this morning they went over and found her laying in the hallway with a blanket.  The patient is confused, states that a woman from outside came into her home, grabbed a blanket off her bed and laid in the hallway with her and she was too weak to get up this morning.  She denies any fall/syncope.  Currently has no pain complaint.  Has had a decreased appetite but no recent fever or acute illness.  Past Medical History:  Diagnosis Date   Ankle fracture, right 2000   Breast cancer ([place]) 04/2007   Left   Cataracts, bilateral    Hyperlipidemia    Hypertension    Hypothyroid    SVT (supraventricular tachycardia) ([place])    Syncope     Patient Active Problem List   Diagnosis Date Noted   Retinal artery occlusion, central, left 07/03/2020   Posterior vitreous detachment of left eye 03/27/2020   Posterior vitreous detachment of right eye 03/27/2020   Central retinal vein occlusion with macular edema of left eye 08/25/2019   Irregular heart beat 06/30/2018   Hypothyroidism 06/30/2018   Hypertension 06/30/2018   Hyperlipidemia 06/21/2013   CAD (coronary artery disease) 06/21/2013   History of breast cancer 06/21/2012    Past Surgical History:  Procedure Laterality Date   ABDOMINAL HYSTERECTOMY     APPENDECTOMY     BREAST CYST EXCISION  1987   again left breast cancer in 2009   CATARACT EXTRACTION Bilateral 2011   MASTECTOMY Left 06/2008   TONSILLECTOMY       OB  History   No obstetric history on file.     Family History  Problem Relation Age of Onset   Hypertension Mother    Heart disease Mother    Heart disease Father    Heart attack Father    Cancer Sister        Pancreatic and liver cancer   Cancer Brother        Colon cancer   Cancer Other        Breast cancer    Social History   Tobacco Use   Smoking status: Never   Smokeless tobacco: Never  Vaping Use   Vaping Use: Never used  Substance Use Topics   Alcohol use: No   Drug use: No    Home Medications Prior to Admission medications   Medication Sig Start Date End Date Taking? Authorizing Provider  aspirin 81 MG tablet Take 81 mg by mouth daily.    [provider]  atorvastatin (LIPITOR) 40 MG tablet Take 1 tablet by mouth once daily 05/03/20   Dettinger, Fransisca Kaufmann, MD  calcium carbonate 200 MG capsule Take 250 mg by mouth daily.    [provider]  Carboxymethylcellulose Sodium (EYE DROPS) 0.5 % SOLN Apply 1 drop to eye daily.    [provider]  cephALEXin (KEFLEX) 500 MG capsule Take 1 capsule (500 mg total)  by mouth 4 (four) times daily. 02/10/20   Dettinger, Fransisca Kaufmann, MD  Cholecalciferol (VITAMIN D) 2000 UNITS tablet Take 2,000 Units by mouth daily.    [provider]  digoxin (LANOXIN) 0.25 MG tablet Take 1 tablet (250 mcg total) by mouth daily. 02/07/20   Dettinger, Fransisca Kaufmann, MD  fish oil-omega-3 fatty acids 1000 MG capsule Take 2 g by mouth 2 (two) times daily.    [provider]  levothyroxine (EUTHYROX) 88 MCG tablet TAKE 1 TABLET BY MOUTH ONCE DAILY BEFORE BREAKFAST 02/07/20   Dettinger, Fransisca Kaufmann, MD  losartan (COZAAR) 25 MG tablet Take 1/2 (one-half) tablet by mouth once daily 05/03/20   Dettinger, Fransisca Kaufmann, MD  Multiple Vitamin (MULTIVITAMIN) tablet Take 1 tablet by mouth daily.    [provider]  verapamil (CALAN-SR) 240 MG CR tablet Take 1 tablet (240 mg total) by mouth at bedtime. 02/07/20   Dettinger, Fransisca Kaufmann, MD     Allergies    Penicillins and Sulfa antibiotics  Review of Systems   Review of Systems  Constitutional:  Positive for appetite change and fatigue. Negative for chills and fever.  HENT:  Negative for congestion.   Eyes:  Negative for visual disturbance.  Respiratory:  Negative for shortness of breath.   Cardiovascular:  Negative for chest pain.  Gastrointestinal:  Negative for abdominal pain, diarrhea and vomiting.  Genitourinary:  Negative for dysuria and hematuria.  Musculoskeletal:  Negative for back pain and neck pain.  Skin:  Negative for rash.  Neurological:  Negative for seizures, syncope, speech difficulty and headaches.   Physical Exam Updated Vital Signs BP (!) 183/108   Pulse 98   Temp 98.7 F (37.1 C) (Rectal)   Resp 16   Ht 5\' 4"  (1.626 m)   Wt 52.2 kg   SpO2 95%   BMI 19.74 kg/m   Physical Exam Vitals and nursing note reviewed.  Constitutional:      Appearance: Normal appearance.  HENT:     Head: Normocephalic.     Mouth/Throat:     Mouth: Mucous membranes are moist.  Eyes:     Pupils: Pupils are equal, round, and reactive to light.  Cardiovascular:     Rate and Rhythm: Normal rate.  Pulmonary:     Effort: Pulmonary effort is normal. No respiratory distress.  Abdominal:     Palpations: Abdomen is soft.     Tenderness: There is no abdominal tenderness. There is no guarding.  Musculoskeletal:        General: No swelling or deformity.     Cervical back: No rigidity or tenderness.  Skin:    General: Skin is warm.  Neurological:     Mental Status: She is alert and oriented to person, place, and time. Mental status is at baseline.  Psychiatric:        Mood and Affect: Mood normal.    ED Results / Procedures / Treatments   Labs (all labs ordered are listed, but only abnormal results are displayed) Labs Reviewed  CBC WITH DIFFERENTIAL/PLATELET  BASIC METABOLIC PANEL  URINALYSIS, ROUTINE W REFLEX MICROSCOPIC    EKG EKG  Interpretation  Date/Time:  Thursday July 19 2020 10:35:25 EDT Ventricular Rate:  102 PR Interval:  150 QRS Duration: 77 QT Interval:  338 QTC Calculation: 428 R Axis:   -44 Text Interpretation: Sinus or ectopic atrial tachycardia Multiform ventricular premature complexes Left anterior fascicular block Anterior infarct, old Sinus tachycardia Confirmed by Lavenia Atlas [id number] on 07/19/2020 10:46:59 AM  Radiology No results found.  Procedures Procedures   Medications Ordered in ED Medications - No data to display  ED Course  I have reviewed the triage vital signs and the nursing notes.  Pertinent labs & imaging results that were available during my care of the patient were reviewed by me and considered in my medical decision making (see chart for details).    MDM Rules/Calculators/A&P                          87 year old female presents emergency department after being found in the hall fire family.  Patient states that she laid herself down in the hall and then was too weak to get up.  Unclear why she laid herself down in the hall.  She denies any trauma, head injury or syncope.  EKG shows sinus rhythm with sinus arrhythmia and a PVC, on the monitor she is otherwise sinus rhythm.  No chest pain.  Head CT is unremarkable, blood work is baseline.  Urine shows no infection.  Abdominal x-ray looks concerning for sigmoid volvulus so a CT was done.  This appears to be a baseline finding for her but today they did find a fecal impaction.  I tried at bedside disimpaction however there is no palpable stool.  I recommended an enema which she is declined.  Patient at this time is alert and oriented, she has capacity to decline medical care.  Concerned that the patient is unsafe at home, had a long discussion with the patient and her son about options.  The patient right now is declined to have anyone in her home, does not want to go to a facility, she is asking to be discharged.  Again she has  capacity to make her own decisions at this time.  Social work is going to talk to the family about options and then plan will be for discharge home to continue her bowel regimen with strict return to ED instructions.  Final Clinical Impression(s) / ED Diagnoses Final diagnoses:  None    Rx / DC Orders ED Discharge Orders     None        Lorelle Gibbs, DO 07/19/20 1651

## 2020-07-19 NOTE — ED Notes (Signed)
Patient to XRay at this time

## 2020-07-19 NOTE — ED Notes (Signed)
Patient was offered and provided crackers and ginger ale after OK from ED provider. Urine specimen obtained by clean catch. Urine culture specimen also sent to lab. Son remains at bedside. Awaiting X Ray results and disposition. Patient offers no complaints.

## 2020-07-19 NOTE — Discharge Instructions (Addendum)
You have been seen and discharged from the emergency department.  You were found to have a distended sigmoid colon which is baseline for you but this is most likely secondary to a fecal impaction/constipation, you have declined an enema here in the department.  Continue your home bowel regimen and enema for relief. Your potassium was slightly low today, you were given a dose of supplements in the ER. Follow-up with your primary provider for reevaluation and further care. Take home medications as prescribed. If you have any worsening symptoms, worsening abdominal distention, rectal pain or further concerns for your health please return to an emergency department for further evaluation.

## 2020-07-19 NOTE — ED Notes (Signed)
BMP redrawn. Patient was encouraged to urinate as soon as possible. Purewick in place and checked for correct placement. Patient offers no complaints.

## 2020-07-19 NOTE — ED Notes (Signed)
Ambulated patient in hallway with walker and without walker with Dr Dina Rich observing. Patient did show instability walking without walker.

## 2020-07-20 ENCOUNTER — Telehealth: Payer: Self-pay | Admitting: Family Medicine

## 2020-07-20 ENCOUNTER — Other Ambulatory Visit: Payer: Self-pay

## 2020-07-20 ENCOUNTER — Ambulatory Visit (INDEPENDENT_AMBULATORY_CARE_PROVIDER_SITE_OTHER): Payer: Medicare Other

## 2020-07-20 DIAGNOSIS — R413 Other amnesia: Secondary | ICD-10-CM

## 2020-07-20 DIAGNOSIS — R296 Repeated falls: Secondary | ICD-10-CM

## 2020-07-20 DIAGNOSIS — Z111 Encounter for screening for respiratory tuberculosis: Secondary | ICD-10-CM | POA: Diagnosis not present

## 2020-07-20 DIAGNOSIS — Z741 Need for assistance with personal care: Secondary | ICD-10-CM

## 2020-07-20 NOTE — Telephone Encounter (Signed)
No longer safe to be at home, family found her sitting on the floor for many hours.  She does have some intermittent memory issues and they are working on getting her into assisted living at [facility].  They are going to bring by the [facility] 2 for [place] to sign and she will likely need testing for both TB and for COVID.  Family is worried that she may not be able to cook or clean sufficiently by herself and want to have her somewhere where she can be checked on in case she has these falls and cannot get up Caryl Pina, MD [place] 07/20/2020, 12:46 PM

## 2020-07-20 NOTE — Progress Notes (Signed)
PPD placed to right forearm.  Patient tolerated well.  She has an appointment with Dr. Warrick Parisian on Monday so test will be read then.  Also performed Covid testing.

## 2020-07-20 NOTE — Telephone Encounter (Signed)
Son came to office w/ ppw from [facility]. TC to [facility], talked w/ [place], they will not be able to place pt until Monday. They require OV notes from last 3 visits. Pt has not been seen since February. Appt has been made for Monday with Tiffany to have FL2 completed. Son will bring her back today to have TB test placed so that it can be read at appt on Monday

## 2020-07-21 LAB — NOVEL CORONAVIRUS, NAA: SARS-CoV-2, NAA: NOT DETECTED

## 2020-07-21 LAB — SARS-COV-2, NAA 2 DAY TAT

## 2020-07-23 ENCOUNTER — Encounter: Payer: Self-pay | Admitting: Family Medicine

## 2020-07-23 ENCOUNTER — Ambulatory Visit (INDEPENDENT_AMBULATORY_CARE_PROVIDER_SITE_OTHER): Payer: Medicare Other | Admitting: Family Medicine

## 2020-07-23 ENCOUNTER — Other Ambulatory Visit: Payer: Self-pay

## 2020-07-23 VITALS — BP 139/82 | HR 84 | Temp 97.4°F | Resp 20 | Ht 64.0 in | Wt 117.1 lb

## 2020-07-23 DIAGNOSIS — I10 Essential (primary) hypertension: Secondary | ICD-10-CM

## 2020-07-23 DIAGNOSIS — K5641 Fecal impaction: Secondary | ICD-10-CM

## 2020-07-23 DIAGNOSIS — E782 Mixed hyperlipidemia: Secondary | ICD-10-CM

## 2020-07-23 DIAGNOSIS — R296 Repeated falls: Secondary | ICD-10-CM

## 2020-07-23 DIAGNOSIS — E039 Hypothyroidism, unspecified: Secondary | ICD-10-CM | POA: Diagnosis not present

## 2020-07-23 DIAGNOSIS — F0391 Unspecified dementia with behavioral disturbance: Secondary | ICD-10-CM | POA: Diagnosis not present

## 2020-07-23 DIAGNOSIS — K5909 Other constipation: Secondary | ICD-10-CM

## 2020-07-23 DIAGNOSIS — F03918 Unspecified dementia, unspecified severity, with other behavioral disturbance: Secondary | ICD-10-CM | POA: Insufficient documentation

## 2020-07-23 DIAGNOSIS — Z7409 Other reduced mobility: Secondary | ICD-10-CM

## 2020-07-23 LAB — TB SKIN TEST
Induration: 0 mm
TB Skin Test: NEGATIVE

## 2020-07-23 MED ORDER — POLYETHYLENE GLYCOL 3350 17 G PO PACK: 17.0000 g | PACK | Freq: Every day | ORAL | 0 refills | Status: DC | PRN

## 2020-07-23 MED ORDER — FLEET ENEMA 7-19 GM/118ML RE ENEM: 1.0000 | ENEMA | Freq: Every day | RECTAL | 0 refills | Status: DC | PRN

## 2020-07-23 NOTE — Patient Instructions (Signed)
Fecal Impaction  A fecal impaction is a large, firm amount of stool (feces) that will not pass out of the body. A fecal impaction usually happens in the rectum, which is in the end of the large intestine. A fecal impaction can blockthe large intestine and cause significant problems. What are the causes? This condition may be caused by anything that slows down bowel movements, including: The long-term use of laxatives, which are medicines that help you have a bowel movement. Constipation. Pain in the rectum. Fecal impaction can occur if you avoid having bowel movements due to the pain. Pain in the rectum can result from a medical condition, such as hemorrhoids or anal fissures. Narcotic pain-relieving medicines, such as methadone, morphine, or codeine. Not drinking enough fluids. Being inactive for a long period of time. Diseases of the brain or nervous system that damage nerves that control the muscles of the intestines. This condition may also be caused by dementia. What are the signs or symptoms? Common symptoms of this condition include: A sense of fullness in the rectum but being unable to pass stool. Changes in bowel patterns. This may include going to the bathroom less often or not at all. Not having a normal number of bowel movements. Thin, watery discharge from the rectum. Pain or cramps in the abdominal area. These often happen after meals. Other symptoms include: Urinating often. Nausea, vomiting, and dehydration. Dizziness and confusion. Rapid heartbeat. Fever and sweating. Changes in blood pressure. Breathing problems. How is this diagnosed? This condition may be diagnosed based on your symptoms and an exam of your rectum. Sometimes, X-rays or lab tests are done to confirm the diagnosis and tocheck for other problems. How is this treated? This condition may be treated by: Having your health care provider remove the stool using a gloved finger. Taking medicine. Using a  suppository or enema in the rectum to soften the stool, which can stimulate a bowel movement. Follow these instructions at home: Eating and drinking  Drink enough fluid to keep your urine pale yellow. Eat foods that are high in fiber, such as beans, whole grains, and fresh fruits and vegetables. If you begin to get constipated, increase the amount of fiber in your diet.  General instructions Develop bowel habits. An example of a bowel habit is having a bowel movement right after breakfast every day. Be sure to give yourself enough time on the toilet. This may require using enemas, bowel softeners, or suppositories at home, as directed by your health care provider. It may also include using mineral oil or olive oil. Do exercises as told by your health care provider. Take over-the-counter and prescription medicines only as told by your health care provider. Keep all follow-up visits as told by your health care provider. This is important. Contact a health care provider if you: Have ongoing pain in your rectum. Need to use an enema or a suppository more than 2 times a week. Have rectal bleeding. Continue to have problems. The problems may include not being able to go to the bathroom and having long-term constipation. Have pain in your abdomen. Have thin, pencil-like stools. Get help right away if: You have black or tarry stools. Summary A fecal impaction is a large, firm amount of stool (feces) that will not pass out of the body. A fecal impaction can block the large intestine and cause significant problems. This condition may be caused by anything that slows down bowel movements. This condition may be treated by having your health  care provider remove the stool using a gloved finger, taking medicine, or using a suppository or enema in the rectum to soften the stool. Develop bowel habits and eat foods that are high in fiber, such as beans, whole grains, and fresh fruits and  vegetables. Contact a health care provider if you continue to have problems. The problems may include not being able to go to the bathroom and having long-term constipation. This information is not intended to replace advice given to you by your health care provider. Make sure you discuss any questions you have with your healthcare provider. Document Revised: 06/22/2018 Document Reviewed: 06/22/2018 Elsevier Patient Education  [place].

## 2020-07-23 NOTE — Progress Notes (Signed)
Established Patient Office Visit  Subjective:  Patient ID: Erica Conner, female    DOB: 09-12-1926  Age: 87 y.o. MRN: [id number]  CC:  Chief Complaint  Patient presents with   FL-2 Form    HPI Erica Conner presents for evaluation for FL-2 form for admission to assisted living. Erica Conner here with her son and daughter in law today. Erica Conner has then fallen 5 times in the last two weeks. Erica Conner not unable to get out a chair by herself anymore. With one fall last week Erica Conner was found after sitting in her urine for 18 hours. Erica Conner reported that a lady came to visit her in the night and told her to get a blanket and lay down with her. Erica Conner requires a walker with assistance to walk. Erica Conner intermittently disoriented. Erica Conner needs assistance with bathing, ambulation, and dressing. 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 Wears bilateral hearing aids. Erica Conner able to feed herself without difficulty. Erica Conner was evaluated in the ED 4 days ago after the fall. A CT of her head was done as well as a CT of her abdomen. Erica Conner has struggled with constipation for awhile. CT of her abdomen was negative for acute blockage. A large stool burden was noted. They attempted to disimpact her for a fecal impaction but this was unsuccessful. Erica Conner declined an enema that day. Erica Conner has not had vomiting, nausea, or a fever. Her abdomen has been distended. Family reports symptoms are now worse than they were 4 days ago. Erica Conner has had 2 small BMs since.   Past Medical History:  Diagnosis Date   Ankle fracture, right 2000   Breast cancer ([place]) 04/2007   Left   Cataracts, bilateral    Hyperlipidemia    Hypertension    Hypothyroid    SVT (supraventricular tachycardia) ([place])    Syncope     Past Surgical History:  Procedure Laterality Date   ABDOMINAL HYSTERECTOMY     APPENDECTOMY     BREAST CYST EXCISION  1987   again left breast cancer in 2009   CATARACT EXTRACTION Bilateral 2011   MASTECTOMY Left 06/2008   TONSILLECTOMY      Family History   Problem Relation Age of Onset   Hypertension Mother    Heart disease Mother    Heart disease Father    Heart attack Father    Cancer Sister        Pancreatic and liver cancer   Cancer Brother        Colon cancer   Cancer Other        Breast cancer    Social History   Socioeconomic History   Marital status: Widowed    Spouse name: Not on file   Number of children: Not on file   Years of education: Not on file   Highest education level: Not on file  Occupational History   Not on file  Tobacco Use   Smoking status: Never   Smokeless tobacco: Never  Vaping Use   Vaping Use: Never used  Substance and Sexual Activity   Alcohol use: No   Drug use: No   Sexual activity: Not Currently    Birth control/protection: Post-menopausal, Surgical  Other Topics Concern   Not on file  Social History Narrative   Husband passed in 2005   Social Determinants of Health   Financial Resource Strain: Not on file  Food Insecurity: Not on file  Transportation Needs: Not on file  Physical  Activity: Not on file  Stress: Not on file  Social Connections: Not on file  Intimate Partner Violence: Not on file    Outpatient Medications Prior to Visit  Medication Sig Dispense Refill   atorvastatin (LIPITOR) 40 MG tablet Take 1 tablet by mouth once daily (Patient taking differently: Take 40 mg by mouth daily.) 90 tablet 0   calcium carbonate 200 MG capsule Take 250 mg by mouth daily.     Carboxymethylcellulose Sodium (EYE DROPS) 0.5 % SOLN Apply 1 drop to eye daily.     Cholecalciferol (VITAMIN D) 2000 UNITS tablet Take 2,000 Units by mouth daily.     digoxin (LANOXIN) 0.25 MG tablet Take 1 tablet (250 mcg total) by mouth daily. 90 tablet 3   fish oil-omega-3 fatty acids 1000 MG capsule Take 2 g by mouth 2 (two) times daily.     levothyroxine (EUTHYROX) 88 MCG tablet TAKE 1 TABLET BY MOUTH ONCE DAILY BEFORE BREAKFAST (Patient taking differently: Take 88 mcg by mouth daily before breakfast.) 90  tablet 3   losartan (COZAAR) 25 MG tablet Take 1/2 (one-half) tablet by mouth once daily (Patient taking differently: Take 12.5 mg by mouth daily.) 45 tablet 0   Multiple Vitamin (MULTIVITAMIN) tablet Take 1 tablet by mouth daily.     psyllium (METAMUCIL) 58.6 % packet Take 1 packet by mouth daily.     verapamil (CALAN-SR) 240 MG CR tablet Take 1 tablet (240 mg total) by mouth at bedtime. 90 tablet 3   cephALEXin (KEFLEX) 500 MG capsule Take 1 capsule (500 mg total) by mouth 4 (four) times daily. (Patient not taking: No sig reported) 28 capsule 0   No facility-administered medications prior to visit.    Allergies  Allergen Reactions   Penicillins Hives   Sulfa Antibiotics Hives    ROS Review of Systems As per HPI.    Objective:    Physical Exam Vitals and nursing note reviewed.  Constitutional:      General: Erica Conner not in acute distress.    Appearance: Erica Conner not ill-appearing, toxic-appearing or diaphoretic.  HENT:     Head: Normocephalic and atraumatic.  Cardiovascular:     Rate and Rhythm: Normal rate and regular rhythm.     Heart sounds: Normal heart sounds. No murmur heard. Pulmonary:     Effort: Pulmonary effort Conner normal. No respiratory distress.     Breath sounds: Normal breath sounds.  Abdominal:     General: Bowel sounds are normal. There Conner distension.     Palpations: There Conner no shifting dullness, fluid wave, mass or pulsatile mass.     Tenderness: There Conner no abdominal tenderness. There Conner no guarding or rebound.  Musculoskeletal:     Right lower leg: No edema.     Left lower leg: No edema.  Skin:    General: Skin Conner warm and dry.  Neurological:     Mental Status: Erica Conner alert. Mental status Conner at baseline.  Psychiatric:        Mood and Affect: Mood normal.        Behavior: Behavior normal.        Cognition and Memory: Cognition Conner impaired. Memory Conner impaired.    BP 139/82   Pulse 84   Temp (!) 97.4 F (36.3 C) (Temporal)   Resp 20   Ht 5\' 4"   (1.626 m)   Wt 117 lb 2 oz (53.1 kg)   SpO2 96%   BMI 20.10 kg/m  Wt Readings from  Last 3 Encounters:  07/23/20 117 lb 2 oz (53.1 kg)  07/19/20 115 lb (52.2 kg)  02/07/20 119 lb (54 kg)   MMSE - Mini Mental State Exam 07/23/2020  Orientation to time 4  Orientation to Place 5  Registration 3  Attention/ Calculation 0  Recall 1  Language- name 2 objects 2  Language- repeat 1  Language- follow 3 step command 3  Language- read & follow direction 1  Write a sentence 1  Copy design 1  Total score 22      Health Maintenance Due  Topic Date Due   Zoster Vaccines- Shingrix (1 of 2) Never done   COVID-19 Vaccine (4 - Booster for Moderna series) 01/31/2020    There are no preventive care reminders to display for this patient.  Lab Results  Component Value Date   TSH 1.660 02/07/2020   Lab Results  Component Value Date   WBC 12.1 (H) 07/19/2020   HGB 12.9 07/19/2020   HCT 38.5 07/19/2020   MCV 84.4 07/19/2020   PLT 475 (H) 07/19/2020   Lab Results  Component Value Date   NA 133 (L) 07/19/2020   K 3.2 (L) 07/19/2020   CO2 22 07/19/2020   GLUCOSE 99 07/19/2020   BUN 9 07/19/2020   CREATININE 0.45 07/19/2020   BILITOT 0.3 02/07/2020   ALKPHOS 58 02/07/2020   AST 24 02/07/2020   ALT 19 02/07/2020   PROT 6.7 02/07/2020   ALBUMIN 4.1 02/07/2020   CALCIUM 8.3 (L) 07/19/2020   ANIONGAP 10 07/19/2020   Lab Results  Component Value Date   CHOL 107 02/07/2020   Lab Results  Component Value Date   HDL 44 02/07/2020   Lab Results  Component Value Date   LDLCALC 38 02/07/2020   Lab Results  Component Value Date   TRIG 149 02/07/2020   Lab Results  Component Value Date   CHOLHDL 2.4 02/07/2020   No results found for: HGBA1C    Assessment & Plan:    Erica Conner was seen today for fl-2 form.  Diagnoses and all orders for this visit:  Dementia with behavioral disturbance, unspecified dementia type ([place])  Primary hypertension  Acquired  hypothyroidism  Recurrent falls  Impaired mobility  Mixed hyperlipidemia  Fecal impaction ([facility]) -     sodium phosphate (FLEET) 7-19 GM/118ML ENEM; Place 133 mLs (1 enema total) rectally daily as needed for severe constipation.  Chronic constipation -     polyethylene glycol (MIRALAX) 17 g packet; Take 17 g by mouth daily as needed for mild constipation.  MMS exam score of 22 days. Appears dementia has progressed significantly in the last 2 week. FL-2 form completed. Patient has difficulty with ambulation, recurrent falls. Erica Conner needs assistance with bathing, ambulation, tolieting. Conner visually and hearing impaired. Form completed and will be faxed to [facility] for admission. Negative Covid and TB screening.  Orders for enema and miralax as above for chronic constipation with fecal impaction. Recent CT scan in ED showed no blockage. Family reports stable abdominal symptoms. Denies fever, vomiting. Has had 2 small BMs. Discussed return precautions.    Follow-up: Return if symptoms worsen or fail to improve. Keep scheduled appointment with PCP.  The patient indicates understanding of these issues and agrees with the plan.   Gwenlyn Perking, FNP

## 2020-07-31 ENCOUNTER — Telehealth: Payer: Self-pay | Admitting: Family Medicine

## 2020-07-31 NOTE — Telephone Encounter (Signed)
Will review tests at time of visit.   Pts son informed and understood. States that pt can wait until the appt next Monday.

## 2020-08-06 ENCOUNTER — Ambulatory Visit (INDEPENDENT_AMBULATORY_CARE_PROVIDER_SITE_OTHER): Payer: Medicare Other | Admitting: Family Medicine

## 2020-08-06 ENCOUNTER — Encounter: Payer: Self-pay | Admitting: Family Medicine

## 2020-08-06 ENCOUNTER — Other Ambulatory Visit: Payer: Self-pay

## 2020-08-06 VITALS — BP 134/65 | HR 72 | Ht 64.0 in | Wt 119.0 lb

## 2020-08-06 DIAGNOSIS — E782 Mixed hyperlipidemia: Secondary | ICD-10-CM

## 2020-08-06 DIAGNOSIS — I10 Essential (primary) hypertension: Secondary | ICD-10-CM | POA: Diagnosis not present

## 2020-08-06 DIAGNOSIS — E039 Hypothyroidism, unspecified: Secondary | ICD-10-CM | POA: Diagnosis not present

## 2020-08-06 MED ORDER — ATORVASTATIN CALCIUM 40 MG PO TABS
40.0000 mg | ORAL_TABLET | Freq: Every day | ORAL | 3 refills | Status: AC
Start: 2020-08-06 — End: ?

## 2020-08-06 MED ORDER — LOSARTAN POTASSIUM 25 MG PO TABS: 12.5000 mg | ORAL_TABLET | Freq: Every day | ORAL | 3 refills | Status: AC

## 2020-08-06 MED ORDER — METAMUCIL FREE & NATURAL 43 % PO POWD: 30.0000 g | Freq: Every day | ORAL | 3 refills | Status: AC

## 2020-08-06 NOTE — Progress Notes (Signed)
BP 134/65   Pulse 72   Ht '5\' 4"'  (1.626 m)   Wt 119 lb (54 kg)   SpO2 97%   BMI 20.43 kg/m    Subjective:   Patient ID: Erica Conner, female    DOB: 05/06/1926, 87 y.o.   MRN: [id number]  HPI: Erica Conner is a 87 y.o. female presenting on 08/06/2020 for Medical Management of Chronic Issues, Hyperlipidemia, Hypertension, and Hypothyroidism   HPI Hypertension Patient is currently on losartan and digoxin, and their blood pressure today is 134/65. Patient denies any lightheadedness or dizziness. Patient denies headaches, blurred vision, chest pains, shortness of breath, or weakness. Denies any side effects from medication and is content with current medication.   Hypothyroidism recheck Patient is coming in for thyroid recheck today as well. They deny any issues with hair changes or heat or cold problems or diarrhea or constipation. They deny any chest pain or palpitations. They are currently on levothyroxine 88 micrograms   Hyperlipidemia Patient is coming in for recheck of his hyperlipidemia. The patient is currently taking atorvastatin and fish oils. They deny any issues with myalgias or history of liver damage from it. They deny any focal numbness or weakness or chest pain.   Patient continues to have intermittent constipation and takes MiraLAX and Metamucil and they seem to help.  Her daughter who is here with her wonders if that is okay to continue and worries about the possibility of developing obstruction in the future.  We did discuss and reassured that it is fine to continue take these and they would help prevent obstruction in the future.  Patient is also at [facility] because she was developing progressive weakness and falling more so her family put her there to be more safe.  Relevant past medical, surgical, family and social history reviewed and updated as indicated. Interim medical history since our last visit reviewed. Allergies and medications  reviewed and updated.  Review of Systems  Constitutional:  Negative for chills and fever.  Eyes:  Negative for visual disturbance.  Respiratory:  Negative for chest tightness and shortness of breath.   Cardiovascular:  Negative for chest pain and leg swelling.  Genitourinary:  Negative for difficulty urinating and dysuria.  Musculoskeletal:  Negative for back pain and gait problem.  Skin:  Negative for rash.  Neurological:  Negative for light-headedness and headaches.  Psychiatric/Behavioral:  Negative for agitation and behavioral problems.   All other systems reviewed and are negative.  Per HPI unless specifically indicated above   Allergies as of 08/06/2020       Reactions   Penicillins Hives   Sulfa Antibiotics Hives        Medication List        Accurate as of August 06, 2020 11:56 AM. If you have any questions, ask your nurse or doctor.          STOP taking these medications    psyllium 58.6 % packet Commonly known as: METAMUCIL Replaced by: Metamucil Free & Natural 43 % Powd Stopped by: Fransisca Kaufmann Amel Gianino, MD       TAKE these medications    atorvastatin 40 MG tablet Commonly known as: LIPITOR Take 1 tablet (40 mg total) by mouth daily.   calcium carbonate 200 MG capsule Take 250 mg by mouth daily.   digoxin 0.25 MG tablet Commonly known as: LANOXIN Take 1 tablet (250 mcg total) by mouth daily.   Eye Drops 0.5 % Soln Apply 1 drop  to eye daily.   fish oil-omega-3 fatty acids 1000 MG capsule Take 2 g by mouth 2 (two) times daily.   levothyroxine 88 MCG tablet Commonly known as: Euthyrox TAKE 1 TABLET BY MOUTH ONCE DAILY BEFORE BREAKFAST What changed:  how much to take how to take this when to take this additional instructions   losartan 25 MG tablet Commonly known as: COZAAR Take 0.5 tablets (12.5 mg total) by mouth daily. What changed: See the new instructions. Changed by: Fransisca Kaufmann Amery Minasyan, MD   Metamucil Free & Natural 43 %  Powd Generic drug: Psyllium Take 30 g by mouth daily. Replaces: psyllium 58.6 % packet Started by: Fransisca Kaufmann Octavious Zidek, MD   multivitamin tablet Take 1 tablet by mouth daily.   polyethylene glycol 17 g packet Commonly known as: MiraLax Take 17 g by mouth daily as needed for mild constipation.   sodium phosphate 7-19 GM/118ML Enem Place 133 mLs (1 enema total) rectally daily as needed for severe constipation.   verapamil 240 MG CR tablet Commonly known as: CALAN-SR Take 1 tablet (240 mg total) by mouth at bedtime.   Vitamin D 50 MCG (2000 UT) tablet Take 2,000 Units by mouth daily.         Objective:   BP 134/65   Pulse 72   Ht '5\' 4"'  (1.626 m)   Wt 119 lb (54 kg)   SpO2 97%   BMI 20.43 kg/m   Wt Readings from Last 3 Encounters:  08/06/20 119 lb (54 kg)  07/23/20 117 lb 2 oz (53.1 kg)  07/19/20 115 lb (52.2 kg)    Physical Exam Vitals and nursing note reviewed.  Constitutional:      General: She is not in acute distress.    Appearance: She is well-developed. She is not diaphoretic.  Eyes:     Conjunctiva/sclera: Conjunctivae normal.     Pupils: Pupils are equal, round, and reactive to light.  Cardiovascular:     Rate and Rhythm: Normal rate and regular rhythm.     Heart sounds: Murmur heard.  Systolic (Holosystolic) murmur is present with a grade of 4/6.  Pulmonary:     Effort: Pulmonary effort is normal. No respiratory distress.     Breath sounds: Normal breath sounds. No wheezing.  Musculoskeletal:        General: No tenderness. Normal range of motion.  Skin:    General: Skin is warm and dry.     Findings: No rash.  Neurological:     Mental Status: She is alert and oriented to person, place, and time.     Coordination: Coordination normal.  Psychiatric:        Behavior: Behavior normal.      Assessment & Plan:   Problem List Items Addressed This Visit       Cardiovascular and Mediastinum   Primary hypertension   Relevant Medications    atorvastatin (LIPITOR) 40 MG tablet   losartan (COZAAR) 25 MG tablet   Other Relevant Orders   CMP14+EGFR     Endocrine   Hypothyroidism   Relevant Orders   TSH     Other   Hyperlipidemia - Primary   Relevant Medications   atorvastatin (LIPITOR) 40 MG tablet   losartan (COZAAR) 25 MG tablet   Other Relevant Orders   CBC with Differential/Platelet   CMP14+EGFR   Lipid panel   Other Visit Diagnoses     Essential hypertension       Relevant Medications   atorvastatin (LIPITOR) 40  MG tablet   losartan (COZAAR) 25 MG tablet       Will check blood work today, continue current medicines.  Gave orders for Metamucil and MiraLAX to be continued, she seems to be doing well on those and her appetite has been stable.  Blood pressure looks good. Follow up plan: Return in about 6 months (around 02/06/2021), or if symptoms worsen or fail to improve, for Hypertension and thyroid and cholesterol.  Counseling provided for all of the vaccine components Orders Placed This Encounter  Procedures   CBC with Differential/Platelet   CMP14+EGFR   Lipid panel   TSH    Caryl Pina, MD [facility] 08/06/2020, 11:56 AM

## 2020-08-07 LAB — LIPID PANEL
Chol/HDL Ratio: 2.1 ratio (ref 0.0–4.4)
Cholesterol, Total: 94 mg/dL — ABNORMAL LOW (ref 100–199)
HDL: 45 mg/dL (ref >39)
LDL Chol Calc (NIH): 33 mg/dL (ref 0–99)
Triglycerides: 79 mg/dL (ref 0–149)
VLDL Cholesterol Cal: 16 mg/dL (ref 5–40)

## 2020-08-07 LAB — CMP14+EGFR
ALT: 16 IU/L (ref 0–32)
AST: 24 IU/L (ref 0–40)
Albumin/Globulin Ratio: 1.1 — ABNORMAL LOW (ref 1.2–2.2)
Albumin: 3.3 g/dL — ABNORMAL LOW (ref 3.5–4.6)
Alkaline Phosphatase: 74 IU/L (ref 44–121)
BUN/Creatinine Ratio: 32 — ABNORMAL HIGH (ref 12–28)
BUN: 18 mg/dL (ref 10–36)
Bilirubin Total: 0.4 mg/dL (ref 0.0–1.2)
CO2: 27 mmol/L (ref 20–29)
Calcium: 9.2 mg/dL (ref 8.7–10.3)
Chloride: 104 mmol/L (ref 96–106)
Creatinine, Ser: 0.57 mg/dL (ref 0.57–1.00)
Globulin, Total: 2.9 g/dL (ref 1.5–4.5)
Glucose: 102 mg/dL — ABNORMAL HIGH (ref 65–99)
Potassium: 3.3 mmol/L — ABNORMAL LOW (ref 3.5–5.2)
Sodium: 144 mmol/L (ref 134–144)
Total Protein: 6.2 g/dL (ref 6.0–8.5)
eGFR: 86 mL/min/{1.73_m2} (ref >59)

## 2020-08-07 LAB — CBC WITH DIFFERENTIAL/PLATELET
Basophils Absolute: 0 10*3/uL (ref 0.0–0.2)
Basos: 0 %
EOS (ABSOLUTE): 0 10*3/uL (ref 0.0–0.4)
Eos: 0 %
Hematocrit: 36.4 % (ref 34.0–46.6)
Hemoglobin: 12.1 g/dL (ref 11.1–15.9)
Immature Grans (Abs): 0 10*3/uL (ref 0.0–0.1)
Immature Granulocytes: 0 %
Lymphocytes Absolute: 1.5 10*3/uL (ref 0.7–3.1)
Lymphs: 13 %
MCH: 27.6 pg (ref 26.6–33.0)
MCHC: 33.2 g/dL (ref 31.5–35.7)
MCV: 83 fL (ref 79–97)
Monocytes Absolute: 1 10*3/uL — ABNORMAL HIGH (ref 0.1–0.9)
Monocytes: 9 %
Neutrophils Absolute: 8.5 10*3/uL — ABNORMAL HIGH (ref 1.4–7.0)
Neutrophils: 78 %
Platelets: 400 10*3/uL (ref 150–450)
RBC: 4.39 x10E6/uL (ref 3.77–5.28)
RDW: 13.5 % (ref 11.7–15.4)
WBC: 11 10*3/uL — ABNORMAL HIGH (ref 3.4–10.8)

## 2020-08-07 LAB — TSH: TSH: 7.31 u[IU]/mL — ABNORMAL HIGH (ref 0.450–4.500)

## 2020-08-21 ENCOUNTER — Telehealth: Payer: Self-pay | Admitting: Family Medicine

## 2020-08-21 DIAGNOSIS — K5909 Other constipation: Secondary | ICD-10-CM

## 2020-08-21 DIAGNOSIS — K5641 Fecal impaction: Secondary | ICD-10-CM

## 2020-08-21 NOTE — Telephone Encounter (Signed)
Please advise, patient had office visit on 08/06/20 and reported she was using Miralax and Metamucil.

## 2020-08-22 MED ORDER — FLEET ENEMA 7-19 GM/118ML RE ENEM: 1.0000 | ENEMA | Freq: Every day | RECTAL | 3 refills | Status: AC | PRN

## 2020-08-22 MED ORDER — FLEET ENEMA 7-19 GM/118ML RE ENEM: 1.0000 | ENEMA | Freq: Every day | RECTAL | 2 refills | Status: DC | PRN

## 2020-08-22 MED ORDER — POLYETHYLENE GLYCOL 3350 17 G PO PACK: 17.0000 g | PACK | Freq: Every day | ORAL | 0 refills | Status: AC | PRN

## 2020-08-22 NOTE — Telephone Encounter (Signed)
[facility]   Faxed order for fleet enema prn if no BM in 2 days and Miralax 17g daily prn  Fax: [phone]

## 2020-08-22 NOTE — Telephone Encounter (Signed)
MiraLAX can be used 17 g daily as needed with at least 8 ounces of fluids.  Patient should be intaking at least six 8 ounce glasses of fluids such as water daily.  Can use fleets enema daily as needed, use if patient has gone more than 2 days without bowel movement despite using MiraLAX I think this should answer most of the questions, we can fax it over to them. Thanks Caryl Pina, MD [facility] 08/22/2020, 1:38 PM

## 2020-08-22 NOTE — Telephone Encounter (Signed)
Spoke with daughter and she is aware.  She said the issue is trying to get her to drink as much water as she should but she will continue encouraging her to do so.  The patient is at [facility] so she requested that we fax over an order to them stating how the miralax should be taken, how often, and for how many days.  Also if we can specify a quantity of water that should be consumed daily.  She also said they will need a specific order stating when the fleets enema should be used if the miralax does not work.

## 2020-08-22 NOTE — Telephone Encounter (Signed)
Have her double up on the miralax and make sure she is drinking a lot of water with it, it only works if you drink a lot of water with it.  Also I have sent a prescription for a fleets enema for her to do. Caryl Pina, MD [facility] 08/22/2020, 7:20 AM

## 2020-08-27 ENCOUNTER — Other Ambulatory Visit: Payer: Self-pay

## 2020-08-27 ENCOUNTER — Telehealth: Payer: Self-pay | Admitting: *Deleted

## 2020-08-27 MED ORDER — GLYCERIN (ADULT) 2 G RE SUPP: 1.0000 | RECTAL | 0 refills | Status: AC | PRN

## 2020-08-27 MED ORDER — GLYCERIN (ADULT) 2 G RE SUPP: 1.0000 | RECTAL | 0 refills | Status: DC | PRN

## 2020-08-28 ENCOUNTER — Telehealth: Payer: Self-pay | Admitting: Family Medicine

## 2020-08-28 NOTE — Telephone Encounter (Signed)
Pts daughter Butch Penny) called to let us know that patient passed away. Also wanted to let Dr Dettinger and staff know that they appreciate [place] for all we did to help take care of patient.

## 2020-08-29 NOTE — Telephone Encounter (Signed)
Okay thanks for letting us know, our prayers are to the family

## 2020-09-06 NOTE — Addendum Note (Signed)
Addended by: Caryl Pina on: 09/15/2020 04:56 PM   Modules accepted: Orders

## 2020-09-06 NOTE — Telephone Encounter (Signed)
Suppository order faxed to [facility] in [place]

## 2020-09-06 NOTE — Telephone Encounter (Signed)
sent a prescription for suppository for them and they can try

## 2020-09-06 NOTE — Telephone Encounter (Signed)
Call w/ Misty from [facility] They have an order for Fleet enema for pt, but her rectum is very swollen Wanting to know if they can get an order for suppository to help decrease the swelling Please advise and if recommended and approved, written order needs to be faxed to [facility] at [phone]

## 2020-09-06 DEATH — deceased

## 2020-10-09 ENCOUNTER — Encounter (INDEPENDENT_AMBULATORY_CARE_PROVIDER_SITE_OTHER): Payer: Medicare Other | Admitting: Ophthalmology

## 2020-10-10 ENCOUNTER — Ambulatory Visit: Payer: Medicare Other | Admitting: Family Medicine

## 2021-01-16 NOTE — Telephone Encounter (Signed)
Will close encounter

## 2021-02-06 ENCOUNTER — Ambulatory Visit: Payer: Medicare Other | Admitting: Family Medicine

## 2021-02-07 ENCOUNTER — Other Ambulatory Visit: Payer: Self-pay | Admitting: Family Medicine

## 2021-02-07 DIAGNOSIS — E039 Hypothyroidism, unspecified: Secondary | ICD-10-CM

## 2021-02-07 DIAGNOSIS — I10 Essential (primary) hypertension: Secondary | ICD-10-CM

## 2021-02-07 DIAGNOSIS — I499 Cardiac arrhythmia, unspecified: Secondary | ICD-10-CM

## 2023-04-13 IMAGING — CT CT ABD-PELV W/ CM
2 of 5 series · 16 of 46 positions shown, 18 images · IV contrast (Omni 300)
Comparison: 10/11/2018.  Plain films today.  02/01/2019 Chest CT

CLINICAL DATA: Abdominal pain

EXAM:
CT ABDOMEN AND PELVIS WITH CONTRAST
TECHNIQUE: Multidetector CT imaging of the abdomen and pelvis was performed
using the standard protocol following bolus administration of
intravenous contrast.
CONTRAST:  100 mL Omnipaque 300 IV

[Series 3: a/p w/ 5mm · axial · 0.77mm/px · z∈[+809,+1199]mm · 13 of 88 slices shown, 15 images]
[im 5/88  soft-tissue]
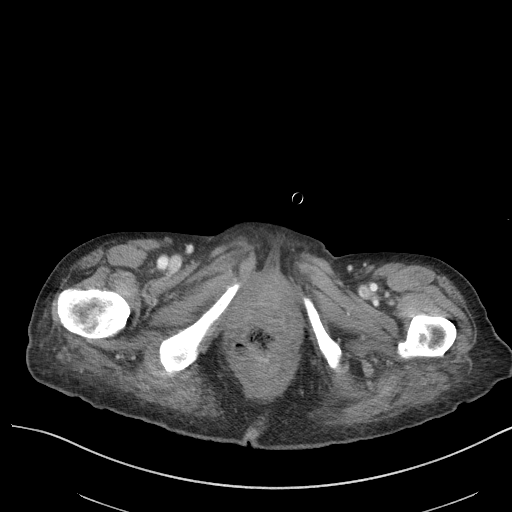
[im 5/88  bone]
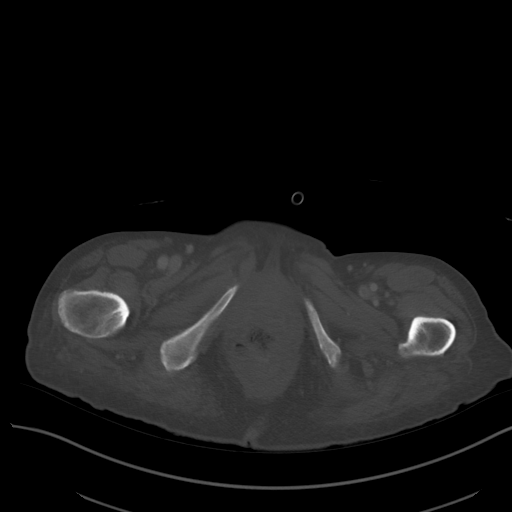
[im 14/88  soft-tissue]
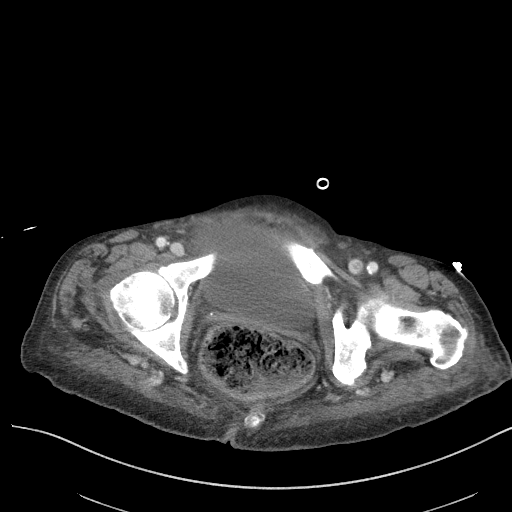
[im 19/88  soft-tissue]
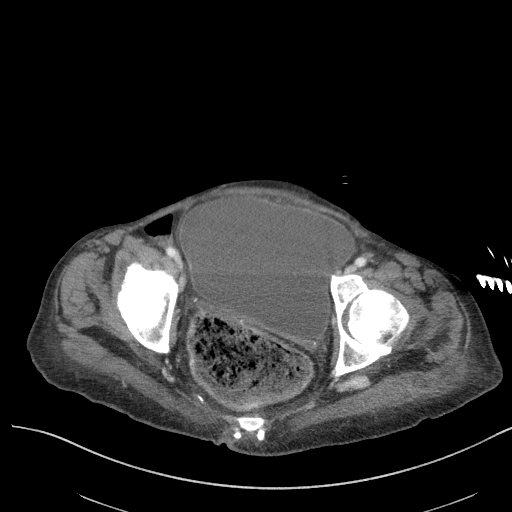
[im 23/88  soft-tissue]
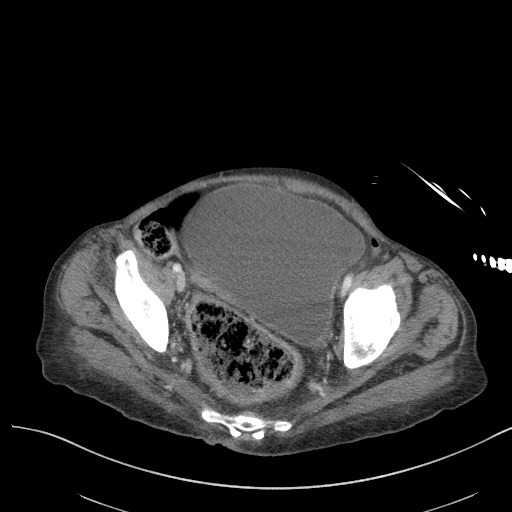
[im 33/88  soft-tissue]
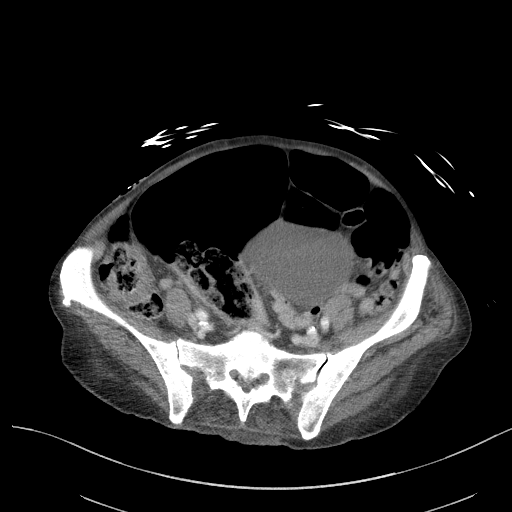
[im 37/88  soft-tissue]
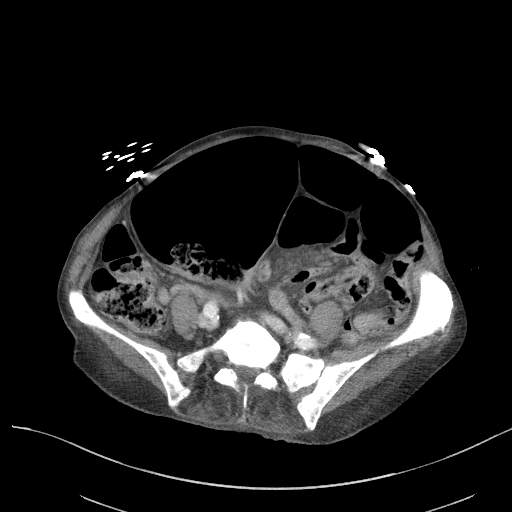
[im 46/88  soft-tissue]
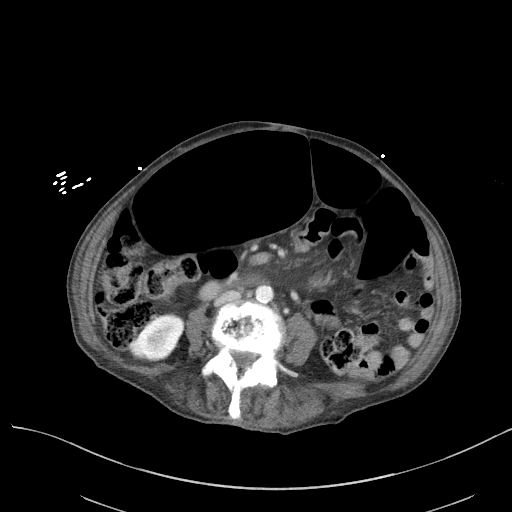
[im 51/88  soft-tissue]
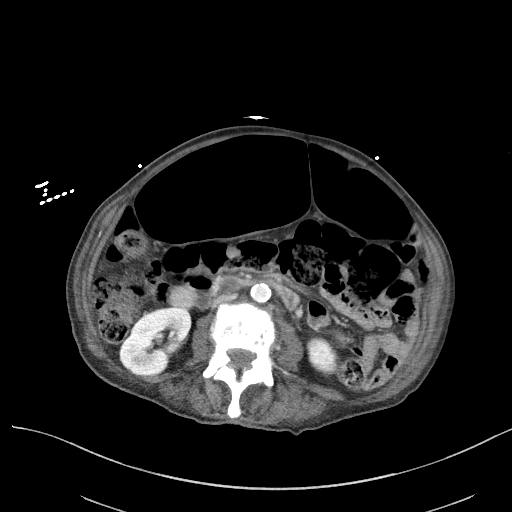
[im 55/88  soft-tissue]
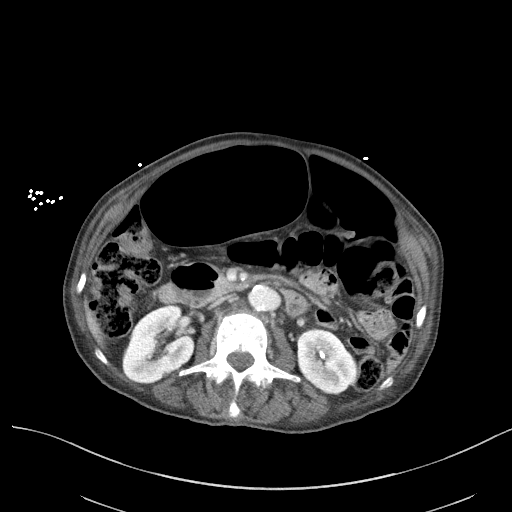
[im 55/88  bone]
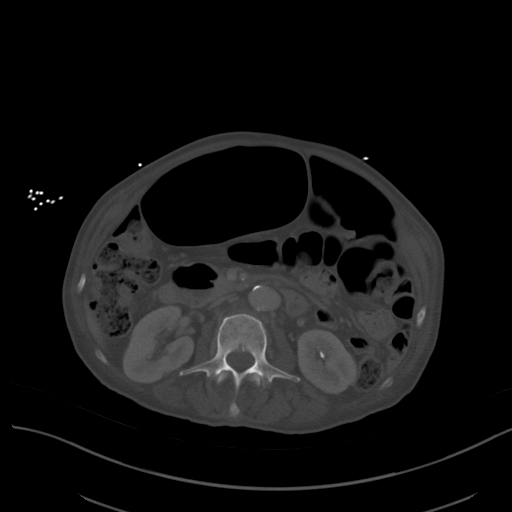
[im 65/88  soft-tissue]
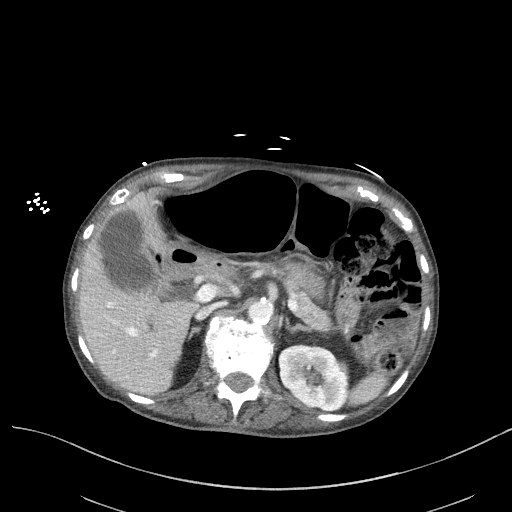
[im 69/88  soft-tissue]
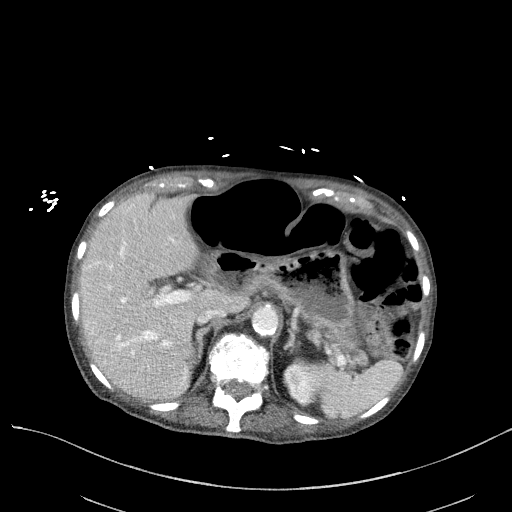
[im 74/88  soft-tissue]
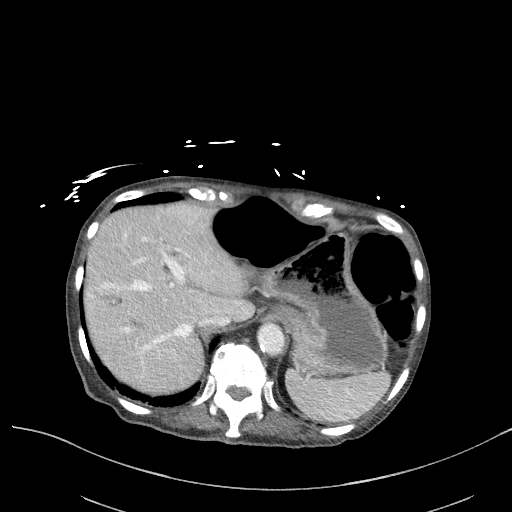
[im 83/88  soft-tissue]
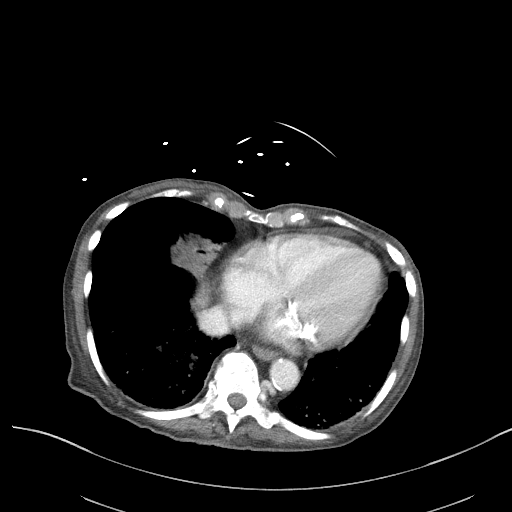

[Series 6: a/p w/ cor · coronal · 0.81mm/px · 3 of 150 slices shown]
[im 50/150  soft-tissue]
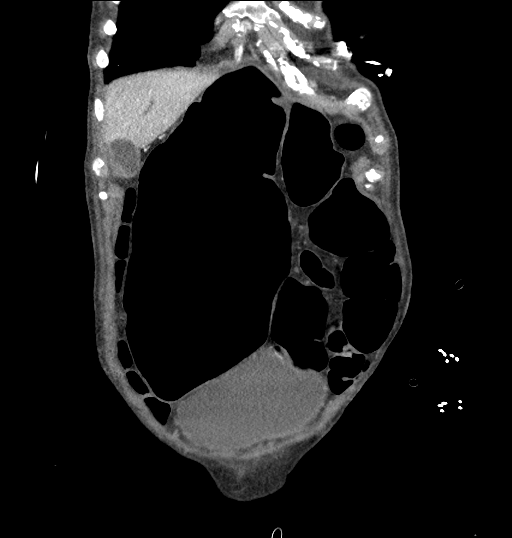
[im 67/150  soft-tissue]
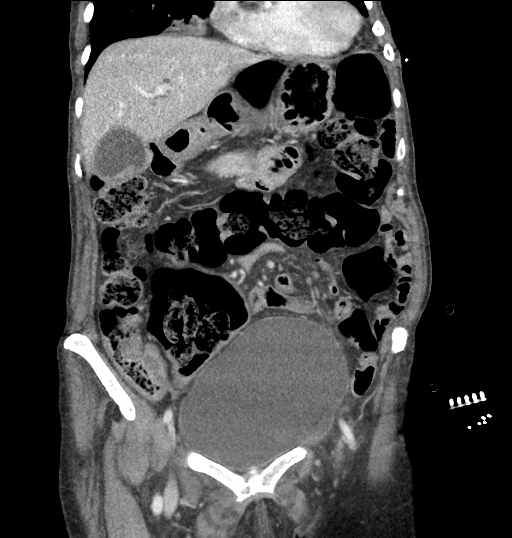
[im 83/150  soft-tissue]
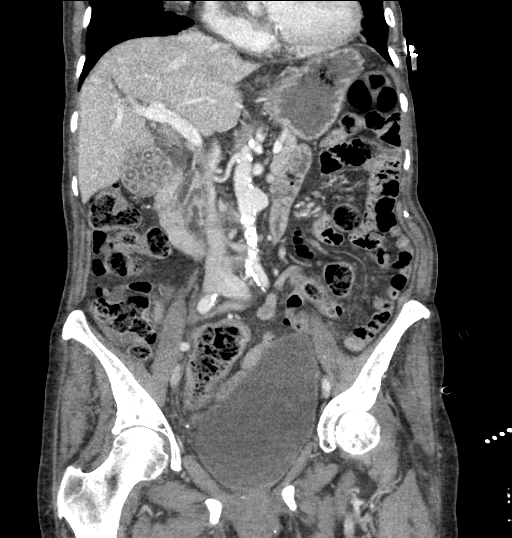

[16 of 46 positions shown; findings below may reference images not displayed]

FINDINGS: Lower chest: Airspace disease in the right middle lobe and both
lower lobes at the lung bases is stable dating back to prior chest
CT from 02/01/2019. No effusions. Mild cardiomegaly.

Hepatobiliary: Numerous gallstones layering within the gallbladder.
No biliary ductal dilatation. No focal hepatic abnormality.

Pancreas: No focal abnormality or ductal dilatation.

Spleen: No focal abnormality.  Normal size.

Adrenals/Urinary Tract: Punctate nonobstructing stone in the lower
pole of the right kidney. 3 mm nonobstructing stone in the lower
pole of the left kidney. No hydronephrosis or ureteral stones.
Adrenal glands and urinary bladder unremarkable.

Stomach/Bowel: Large stool burden in the rectum. There is marked
gaseous distention of the sigmoid colon as seen on plain films
earlier today. No evidence of volvulus. Stomach and small bowel
decompressed, grossly unremarkable.

Vascular/Lymphatic: Heavily calcified aorta. Saccular aneurysm
within the mid aorta measuring 2.5 cm. No adenopathy.

Reproductive: Prior hysterectomy.

Other: No free fluid or free air.

Musculoskeletal: No acute bony abnormality.
IMPRESSION: Severe gaseous distention of the sigmoid colon. No evidence of
sigmoid volvulus. Large stool burden in the rectum could reflect
fecal impaction.

Cholelithiasis.

Persistent masslike airspace opacity in the right middle lobe and
areas of consolidation in the lower lobes. This is stable dating
back to January 2019.
